# Patient Record
Sex: Male | Born: 1955
Health system: Southern US, Community
[De-identification: ages and names within clinical notes are randomized; demographics above are authoritative.]

## PROBLEM LIST (undated history)

## (undated) DIAGNOSIS — K5790 Diverticulosis of intestine, part unspecified, without perforation or abscess without bleeding: Secondary | ICD-10-CM

## (undated) DIAGNOSIS — I10 Essential (primary) hypertension: Secondary | ICD-10-CM

## (undated) DIAGNOSIS — G4730 Sleep apnea, unspecified: Secondary | ICD-10-CM

## (undated) DIAGNOSIS — E785 Hyperlipidemia, unspecified: Secondary | ICD-10-CM

## (undated) DIAGNOSIS — T7840XA Allergy, unspecified, initial encounter: Secondary | ICD-10-CM

## (undated) DIAGNOSIS — M545 Low back pain, unspecified: Secondary | ICD-10-CM

## (undated) DIAGNOSIS — Z860100 Personal history of colon polyps, unspecified: Secondary | ICD-10-CM

## (undated) DIAGNOSIS — G8929 Other chronic pain: Secondary | ICD-10-CM

## (undated) HISTORY — DX: Allergy, unspecified, initial encounter: T78.40XA

## (undated) HISTORY — DX: Diverticulosis of intestine, part unspecified, without perforation or abscess without bleeding: K57.90

## (undated) HISTORY — DX: Low back pain, unspecified: M54.50

## (undated) HISTORY — DX: Low back pain: M54.5

## (undated) HISTORY — PX: KNEE SURGERY: SHX244

## (undated) HISTORY — DX: Personal history of colonic polyps: Z86.010

## (undated) HISTORY — DX: Sleep apnea, unspecified: G47.30

## (undated) HISTORY — DX: Hyperlipidemia, unspecified: E78.5

## (undated) HISTORY — DX: Personal history of colon polyps, unspecified: Z86.0100

## (undated) HISTORY — DX: Essential (primary) hypertension: I10

## (undated) HISTORY — DX: Other chronic pain: G89.29

---
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
## 2005-05-30 ENCOUNTER — Ambulatory Visit: Payer: Self-pay | Admitting: Gastroenterology

## 2005-06-13 ENCOUNTER — Ambulatory Visit: Payer: Self-pay | Admitting: Gastroenterology

## 2005-06-13 ENCOUNTER — Encounter (INDEPENDENT_AMBULATORY_CARE_PROVIDER_SITE_OTHER): Payer: Self-pay | Admitting: *Deleted

## 2005-06-13 LAB — HM COLONOSCOPY: HM Colonoscopy: NORMAL

## 2005-12-14 ENCOUNTER — Ambulatory Visit: Payer: Self-pay | Admitting: Family Medicine

## 2006-05-22 ENCOUNTER — Ambulatory Visit: Payer: Self-pay | Admitting: Family Medicine

## 2006-06-21 ENCOUNTER — Ambulatory Visit: Payer: Self-pay | Admitting: Family Medicine

## 2007-04-24 ENCOUNTER — Ambulatory Visit: Payer: Self-pay | Admitting: Family Medicine

## 2007-09-04 ENCOUNTER — Ambulatory Visit: Payer: Self-pay | Admitting: Family Medicine

## 2007-11-02 ENCOUNTER — Ambulatory Visit: Payer: Self-pay | Admitting: Family Medicine

## 2008-06-10 ENCOUNTER — Ambulatory Visit: Payer: Self-pay | Admitting: Family Medicine

## 2008-07-19 ENCOUNTER — Emergency Department (HOSPITAL_COMMUNITY): Admission: EM | Admit: 2008-07-19 | Discharge: 2008-07-20 | Payer: Self-pay | Admitting: Emergency Medicine

## 2008-09-24 ENCOUNTER — Ambulatory Visit: Payer: Self-pay | Admitting: Family Medicine

## 2009-04-21 ENCOUNTER — Ambulatory Visit: Payer: Self-pay | Admitting: Family Medicine

## 2009-07-23 ENCOUNTER — Ambulatory Visit: Payer: Self-pay | Admitting: Family Medicine

## 2010-02-09 ENCOUNTER — Ambulatory Visit: Payer: Self-pay | Admitting: Family Medicine

## 2010-04-08 ENCOUNTER — Ambulatory Visit (HOSPITAL_BASED_OUTPATIENT_CLINIC_OR_DEPARTMENT_OTHER)
Admission: RE | Admit: 2010-04-08 | Discharge: 2010-04-08 | Payer: Self-pay | Source: Home / Self Care | Attending: Family Medicine | Admitting: Family Medicine

## 2010-04-08 ENCOUNTER — Encounter: Payer: Self-pay | Admitting: Pulmonary Disease

## 2010-04-30 ENCOUNTER — Ambulatory Visit
Admission: RE | Admit: 2010-04-30 | Discharge: 2010-04-30 | Payer: Self-pay | Source: Home / Self Care | Attending: Family Medicine | Admitting: Family Medicine

## 2010-06-02 ENCOUNTER — Encounter: Payer: Self-pay | Admitting: Gastroenterology

## 2010-06-09 NOTE — Letter (Signed)
Summary: Colonoscopy Letter  [facility]  [address]   [place], [place] [address]   Phone: [phone]  Fax: [phone]      June 02, 2010 MRN: [id number]   Antonio Harvey [address] [place], [place]  [address]   Dear Mr. Braddy,   According to your medical record, it is time for you to schedule a Colonoscopy. The American Cancer Society recommends this procedure as a method to detect early colon cancer. Patients with a family history of colon cancer, or a personal history of colon polyps or inflammatory bowel disease are at increased risk.  This letter has been generated based on the recommendations made at the time of your procedure. If you feel that in your particular situation this may no longer apply, please contact our office.  Please call our office at [phone] to schedule this appointment or to update your records at your earliest convenience.  Thank you for cooperating with [place] to provide you with the very best care possible.   Sincerely,   Sheryn Bison, M.D.  [facility] Gastroenterology Division [phone]

## 2010-10-22 ENCOUNTER — Encounter: Payer: Self-pay | Admitting: Family Medicine

## 2010-10-25 ENCOUNTER — Ambulatory Visit (INDEPENDENT_AMBULATORY_CARE_PROVIDER_SITE_OTHER): Payer: BC Managed Care – PPO | Admitting: Medical

## 2010-10-25 ENCOUNTER — Encounter: Payer: Self-pay | Admitting: Medical

## 2010-10-25 VITALS — BP 124/82 | HR 60 | Temp 98.1°F | Ht 69.0 in | Wt 193.0 lb

## 2010-10-25 DIAGNOSIS — E785 Hyperlipidemia, unspecified: Secondary | ICD-10-CM

## 2010-10-25 DIAGNOSIS — K635 Polyp of colon: Secondary | ICD-10-CM

## 2010-10-25 DIAGNOSIS — D126 Benign neoplasm of colon, unspecified: Secondary | ICD-10-CM

## 2010-10-25 LAB — LIPID PANEL
Cholesterol: 143 mg/dL (ref 0–200)
HDL: 40 mg/dL (ref >39)
Total CHOL/HDL Ratio: 3.6 Ratio

## 2010-10-25 LAB — COMPREHENSIVE METABOLIC PANEL
Albumin: 4.6 g/dL (ref 3.5–5.2)
Alkaline Phosphatase: 86 U/L (ref 39–117)
BUN: 16 mg/dL (ref 6–23)
Glucose, Bld: 94 mg/dL (ref 70–99)
Potassium: 4.2 mEq/L (ref 3.5–5.3)

## 2010-10-25 NOTE — Progress Notes (Signed)
Subjective:    Antonio Harvey is here for follow up of elevated cholesterol. Last visit here 1/12.  Compliance with treatment has been excellent. The patient exercises frequently.  He avoids lots of red meats.  Eats mostly healthy diet with vegetables, fruits, chicken, fish.  Patient denies muscle pain associated with his medications.  He does note some hand stiffness and joint aches in the hands at times.  Risk factors for heart disease include age and male only.  Father did die of stroke.    Last colonoscopy 2007 with diverticulosis and polyps.  No other new c/o.   The following portions of the patient's history were reviewed and updated as appropriate: allergies, current medications, past family history, past medical history, past social history, past surgical history and problem list.  Past Medical History  Diagnosis Date  . Allergy   . Hyperlipidemia   . History of colonic polyps   . Sleep apnea   . Chronic LBP   . Diverticulosis     Review of Systems Constitutional: denies recent fevers, chills, weight changes Skin: denies rash, or other worrisome lesion Cardiology: denies chest pain, palpitations, edema Respiratory: denies shortness of breath, dyspnea on exertion, wheezing Gastroenterology: no abdominal pain, nausea, vomiting, diarrhea, constipation, bowel change, blood in stool Musculoskeletal: denies myalgias, cramps Urology: denies dysuria, frequency, urgency, blood in urine, stream changes Neurology: denies numbness, tingling, weakness, gait changes    Objective:   Filed Vitals:   10/25/10 0923  BP: 124/82  Pulse: 60  Temp: 98.1 F (36.7 C)    General Appearance:    Alert, no distress, WD/WN, black male  Oral cavity:   mucosa normal  Neck:   Supple, no lymphadenopathy;  thyroid:  no    enlargement/tenderness/nodules; no carotid   bruit or JVD  Lungs:     Clear to auscultation bilaterally without wheezes, rales or       rhonchi; respirations unlabored   Heart:     Regular rate and rhythm, S1 and S2 normal, no murmur  Abdomen:     Soft, non-tender, non distended, normoactive bowel sounds,    no masses, no hepatosplenomegaly, no bruits  Extremities:   No clubbing, cyanosis or edema  Pulses:   2+ and symmetric all extremities                Assessment:   Encounter Diagnoses  Name Primary?  . Hyperlipidemia Yes  . Colon polyp      Plan:  Hyperlipidemia:  1. Continue dietary measures. 2. Continue regular exercise. 3. Lipid-lowering medications: Lipitor 20mg .  Consider lowering to 10mg  at patient request if LDL <100.   4. Follow up in 4 months.   Colon polyp - he will call GI to schedule repeat colonoscopy in the next few months.  Last colonoscopy 2007 with plan to repeat in 5 years due to polyps.

## 2010-10-26 ENCOUNTER — Other Ambulatory Visit: Payer: Self-pay | Admitting: Medical

## 2010-10-26 ENCOUNTER — Other Ambulatory Visit: Payer: Self-pay | Admitting: *Deleted

## 2010-10-26 MED ORDER — ATORVASTATIN CALCIUM 20 MG PO TABS: 10.0000 mg | ORAL_TABLET | Freq: Every day | ORAL | Status: DC

## 2010-10-26 NOTE — Telephone Encounter (Addendum)
Message copied by Dorthula Perfect on Tue Oct 26, 2010  1:08 PM ------      Message from: Jac Canavan      Created: Tue Oct 26, 2010  7:46 AM       His bad cholesterol is 18, thus, as we discussed, he can lower his Lipitor to 10 mg, or one half tablet of his current dose daily.  As long as he is maintaining a healthy diet and exercise, we can recheck these numbers again in 4-6 months. His liver tests were normal. His kidney test was slightly elevated, and we can recheck this again in 4 months.   Pt notified of lab results.  Pt understood to take Lipitor 10 mg daily and was informed that prescription for Lipitor was sent to pharmacy.  Pt scheduled to come back in October for a recheck on Labs.  CM, LPN

## 2010-10-27 ENCOUNTER — Telehealth: Payer: Self-pay | Admitting: Medical

## 2010-10-27 NOTE — Telephone Encounter (Signed)
Done

## 2010-12-07 ENCOUNTER — Other Ambulatory Visit: Payer: Self-pay

## 2010-12-07 ENCOUNTER — Telehealth: Payer: Self-pay | Admitting: Medical

## 2010-12-07 MED ORDER — ATORVASTATIN CALCIUM 20 MG PO TABS: 10.0000 mg | ORAL_TABLET | Freq: Every day | ORAL | Status: DC

## 2010-12-07 NOTE — Telephone Encounter (Signed)
Renewed med 90 day supply actually he takes 1/2 a pill daily so 180 day supply

## 2010-12-07 NOTE — Telephone Encounter (Signed)
Med was ordered

## 2011-01-24 ENCOUNTER — Encounter: Payer: Self-pay | Admitting: Family Medicine

## 2011-01-25 ENCOUNTER — Ambulatory Visit (INDEPENDENT_AMBULATORY_CARE_PROVIDER_SITE_OTHER): Payer: BC Managed Care – PPO | Admitting: Medical

## 2011-01-25 ENCOUNTER — Encounter: Payer: Self-pay | Admitting: Medical

## 2011-01-25 VITALS — BP 124/80 | HR 58 | Resp 12 | Ht 69.0 in | Wt 200.0 lb

## 2011-01-25 DIAGNOSIS — K635 Polyp of colon: Secondary | ICD-10-CM

## 2011-01-25 DIAGNOSIS — D126 Benign neoplasm of colon, unspecified: Secondary | ICD-10-CM

## 2011-01-25 DIAGNOSIS — E785 Hyperlipidemia, unspecified: Secondary | ICD-10-CM

## 2011-01-25 DIAGNOSIS — H6120 Impacted cerumen, unspecified ear: Secondary | ICD-10-CM

## 2011-01-25 DIAGNOSIS — R7989 Other specified abnormal findings of blood chemistry: Secondary | ICD-10-CM

## 2011-01-25 DIAGNOSIS — H9319 Tinnitus, unspecified ear: Secondary | ICD-10-CM

## 2011-01-25 DIAGNOSIS — G4730 Sleep apnea, unspecified: Secondary | ICD-10-CM

## 2011-01-25 DIAGNOSIS — R799 Abnormal finding of blood chemistry, unspecified: Secondary | ICD-10-CM

## 2011-01-25 LAB — BASIC METABOLIC PANEL
BUN: 12 mg/dL (ref 6–23)
Chloride: 103 mEq/L (ref 96–112)
Potassium: 4.4 mEq/L (ref 3.5–5.3)

## 2011-01-25 LAB — CBC WITH DIFFERENTIAL/PLATELET
Eosinophils Absolute: 0.1 10*3/uL (ref 0.0–0.7)
Eosinophils Relative: 1 % (ref 0–5)
HCT: 43.3 % (ref 39.0–52.0)
Hemoglobin: 14 g/dL (ref 13.0–17.0)
Lymphs Abs: 2.1 10*3/uL (ref 0.7–4.0)
MCH: 23.5 pg — ABNORMAL LOW (ref 26.0–34.0)
MCV: 72.8 fL — ABNORMAL LOW (ref 78.0–100.0)
Monocytes Absolute: 0.3 10*3/uL (ref 0.1–1.0)
Monocytes Relative: 5 % (ref 3–12)
Platelets: 153 10*3/uL (ref 150–400)
RBC: 5.95 MIL/uL — ABNORMAL HIGH (ref 4.22–5.81)

## 2011-01-25 LAB — LIPID PANEL
LDL Cholesterol: 90 mg/dL (ref 0–99)
VLDL: 19 mg/dL (ref 0–40)

## 2011-01-25 NOTE — Progress Notes (Signed)
Subjective:    Antonio Harvey is here for follow up.  He sees both [place] and the [place] [facility] for primary care.    Here for recheck on cholesterol.  Last visit we lowered his Lipitor to 10mg  daily given his improved lipid labs.  He is here for recheck on this today.    He is exercising, eating healthy.   He has repeat colonoscopy this month for polyps.  Last colonoscopy 2007.  He has CPAP titration scheduled soon for mask refitting.    He has ENT f/u soon through [facility] hospital for tinnitus.  He c/o ear wax blocked up today.  He has had to have irrigation in the past for same.   Last PSA screening 2/12 at the [place].  The following portions of the patient's history were reviewed and updated as appropriate: allergies, current medications, past family history, past medical history, past social history, past surgical history and problem list.  Past Medical History  Diagnosis Date  . Allergy   . Hyperlipidemia   . History of colonic polyps   . Sleep apnea   . Chronic LBP   . Diverticulosis     Review of Systems Constitutional: denies recent fevers, chills, weight changes Skin: denies rash, or other worrisome lesion Cardiology: denies chest pain, palpitations, edema Respiratory: denies shortness of breath, dyspnea on exertion, wheezing Gastroenterology: no abdominal pain, nausea, vomiting, diarrhea, constipation, bowel change, blood in stool Musculoskeletal: denies myalgias, cramps Urology: denies dysuria, frequency, urgency, blood in urine, stream changes Neurology: denies numbness, tingling, weakness, gait changes    Objective:   Filed Vitals:   01/25/11 0939  BP: 124/80  Pulse: 58    General Appearance:    Alert, no distress, WD/WN, black male  Oral cavity:   mucosa normal Ears: moderate impacted cerumen bilat  Neck:   Supple, no lymphadenopathy;  thyroid:  no    enlargement/tenderness/nodules; no carotid   bruit or JVD  Lungs:     Clear to auscultation bilaterally without wheezes,  rales or       rhonchi; respirations unlabored   Heart:    Regular rate and rhythm, S1 and S2 normal, no murmur  Abdomen:     Soft, non-tender, non distended, normoactive bowel sounds,    no masses, no hepatosplenomegaly, no bruits  Extremities:   No clubbing, cyanosis or edema  Pulses:   2+ and symmetric all extremities                Assessment:   Encounter Diagnoses  Name Primary?  . Hyperlipidemia Yes  . Impacted cerumen   . Creatinine elevation   . Tinnitus   . Sleep apnea   . Colon polyp      Plan:  Hyperlipidemia - last visit we reduced to Lipitor 10mg  daily.  Will check labs today.  If at goal, recheck lipid in 1 year.  Impacted cerumen - at his request, we successful removed moderate cerumen impaction from bilat ear canals with warm water flush  Creatinine elevated - recheck labs today  Tinnitus - f/u with [facility] ENT for eval as scheduled.  Sleep Apnea - f/u as planned for sleep study through [place]  Colon Polyp - is scheduled this month for repeat colonoscopy

## 2011-01-25 NOTE — Patient Instructions (Signed)
We will recheck your cholesterol today.    We cleaned impacted ear wax out of your ears today.  Follow up soon as planned for repeat colonoscopy.   Follow up as planned for evaluation of ringing in ears at the [place].  Follow up as planned for your CPAP titration soon as scheduled.  Get your flu shot soon through work.   Continue regular exercise, healthy diet.  We will call with lab results.

## 2011-11-09 ENCOUNTER — Encounter: Payer: BC Managed Care – PPO | Admitting: Medical

## 2011-11-14 ENCOUNTER — Encounter: Payer: BC Managed Care – PPO | Admitting: Medical

## 2011-11-28 ENCOUNTER — Ambulatory Visit (AMBULATORY_SURGERY_CENTER): Payer: BC Managed Care – PPO

## 2011-11-28 VITALS — Ht 69.0 in | Wt 200.0 lb

## 2011-11-28 DIAGNOSIS — Z860100 Personal history of colon polyps, unspecified: Secondary | ICD-10-CM

## 2011-11-28 MED ORDER — MOVIPREP 100 G PO SOLR: 1.0000 | Freq: Once | ORAL | Status: DC

## 2011-11-29 ENCOUNTER — Encounter: Payer: Self-pay | Admitting: Gastroenterology

## 2011-12-12 ENCOUNTER — Ambulatory Visit (AMBULATORY_SURGERY_CENTER): Payer: BC Managed Care – PPO | Admitting: Gastroenterology

## 2011-12-12 ENCOUNTER — Encounter: Payer: Self-pay | Admitting: Gastroenterology

## 2011-12-12 VITALS — BP 133/79 | HR 75 | Temp 97.9°F | Resp 15 | Ht 69.0 in | Wt 200.0 lb

## 2011-12-12 DIAGNOSIS — D126 Benign neoplasm of colon, unspecified: Secondary | ICD-10-CM

## 2011-12-12 DIAGNOSIS — Z86010 Personal history of colonic polyps: Secondary | ICD-10-CM

## 2011-12-12 MED ORDER — SODIUM CHLORIDE 0.9 % IV SOLN: 500.0000 mL | INTRAVENOUS | Status: DC

## 2011-12-12 NOTE — Progress Notes (Signed)
Patient did not experience any of the following events: a burn prior to discharge; a fall within the facility; wrong site/side/patient/procedure/implant event; or a hospital transfer or hospital admission upon discharge from the facility. (G8907) Patient did not have preoperative order for IV antibiotic SSI prophylaxis. (G8918)  

## 2011-12-12 NOTE — Op Note (Signed)
[facility] [address] [place], [address]   COLONOSCOPY PROCEDURE REPORT  PATIENT: Antonio, Harvey  MR#: [id number] BIRTHDATE: 1955/09/24 , 56  yrs. old GENDER: Male ENDOSCOPIST: Mardella Layman, MD, [facility] REFERRED BY: PROCEDURE DATE:  12/12/2011 PROCEDURE:   Colonoscopy with biopsy ASA CLASS:   Class II INDICATIONS:follow up of adenomatous colonic polyp(s). MEDICATIONS: propofol (Diprivan) 200mg  IV  DESCRIPTION OF PROCEDURE:   After the risks and benefits and of the procedure were explained, informed consent was obtained.  A digital rectal exam revealed no abnormalities of the rectum.    The LB [id number]  endoscope was introduced through the anus and advanced to the cecum, which was identified by both the appendix and ileocecal valve .  The quality of the prep was excellent, using MoviPrep .  The instrument was then slowly withdrawn as the colon was fully examined.     COLON FINDINGS: A diminutive flat polyp was found in the rectum.  A biopsy was performed.   The colon mucosa was otherwise normal. Moderate diverticulosis was noted in the descending colon and sigmoid colon.     Retroflexed views revealed no abnormalities. The scope was then withdrawn from the patient and the procedure completed.  COMPLICATIONS: There were no complications. ENDOSCOPIC IMPRESSION: Diminutive flat polyp was found in the rectum; biopsy was performed ,R/O ADENOMA.  RECOMMENDATIONS: 1.  Continue current medications 2.  Await pathology results 3.  Continue current colorectal screening recommendations for "routine risk" patients with a repeat colonoscopy in 10 years. 4.  High fiber diet   REPEAT EXAM:  cc:Talmadge Coventry, MD  _______________________________ eSigned:  Mardella Layman, MD, [facility] 12/12/2011 1:58 PM     PATIENT NAME:  Antonio, Harvey MR#: [id number]

## 2011-12-12 NOTE — Patient Instructions (Addendum)
Discharge instructions given with verbal understanding. Handout on polyps. Resume previous medications. YOU HAD AN ENDOSCOPIC PROCEDURE TODAY AT THE [facility]: Refer to the procedure report that was given to you for any specific questions about what was found during the examination.  If the procedure report does not answer your questions, please call your gastroenterologist to clarify.  If you requested that your care partner not be given the details of your procedure findings, then the procedure report has been included in a sealed envelope for you to review at your convenience later.  YOU SHOULD EXPECT: Some feelings of bloating in the abdomen. Passage of more gas than usual.  Walking can help get rid of the air that was put into your GI tract during the procedure and reduce the bloating. If you had a lower endoscopy (such as a colonoscopy or flexible sigmoidoscopy) you may notice spotting of blood in your stool or on the toilet paper. If you underwent a bowel prep for your procedure, then you may not have a normal bowel movement for a few days.  DIET: Your first meal following the procedure should be a light meal and then it is ok to progress to your normal diet.  A half-sandwich or bowl of soup is an example of a good first meal.  Heavy or fried foods are harder to digest and may make you feel nauseous or bloated.  Likewise meals heavy in dairy and vegetables can cause extra gas to form and this can also increase the bloating.  Drink plenty of fluids but you should avoid alcoholic beverages for 24 hours.  ACTIVITY: Your care partner should take you home directly after the procedure.  You should plan to take it easy, moving slowly for the rest of the day.  You can resume normal activity the day after the procedure however you should NOT DRIVE or use heavy machinery for 24 hours (because of the sedation medicines used during the test).    SYMPTOMS TO REPORT IMMEDIATELY: A  gastroenterologist can be reached at any hour.  During normal business hours, 8:30 AM to 5:00 PM Monday through Friday, call [phone].  After hours and on weekends, please call the GI answering service at [phone] who will take a message and have the physician on call contact you.   Following lower endoscopy (colonoscopy or flexible sigmoidoscopy):  Excessive amounts of blood in the stool  Significant tenderness or worsening of abdominal pains  Swelling of the abdomen that is new, acute  Fever of 100F or higher  FOLLOW UP: If any biopsies were taken you will be contacted by phone or by letter within the next 1-3 weeks.  Call your gastroenterologist if you have not heard about the biopsies in 3 weeks.  Our staff will call the home number listed on your records the next business day following your procedure to check on you and address any questions or concerns that you may have at that time regarding the information given to you following your procedure. This is a courtesy call and so if there is no answer at the home number and we have not heard from you through the emergency physician on call, we will assume that you have returned to your regular daily activities without incident.  SIGNATURES/CONFIDENTIALITY: You and/or your care partner have signed paperwork which will be entered into your electronic medical record.  These signatures attest to the fact that that the information above on your After Visit Summary has been   reviewed and is understood.  Full responsibility of the confidentiality of this discharge information lies with you and/or your care-partner. 

## 2011-12-13 ENCOUNTER — Telehealth: Payer: Self-pay

## 2011-12-13 NOTE — Telephone Encounter (Signed)
  Follow up Call-  Call back number 12/12/2011  Post procedure Call Back phone  # [phone]  Permission to leave phone message Yes     Patient questions:  Do you have a fever, pain , or abdominal swelling? no Pain Score  0 *  Have you tolerated food without any problems? yes  Have you been able to return to your normal activities? yes  Do you have any questions about your discharge instructions: Diet   no Medications  no Follow up visit  no  Do you have questions or concerns about your Care? no  Actions: * If pain score is 4 or above: No action needed, pain <4.

## 2011-12-16 ENCOUNTER — Encounter: Payer: Self-pay | Admitting: Gastroenterology

## 2011-12-22 ENCOUNTER — Other Ambulatory Visit: Payer: Self-pay | Admitting: Medical

## 2011-12-22 NOTE — Telephone Encounter (Signed)
No more refills patient needs a office visit.

## 2012-01-25 ENCOUNTER — Other Ambulatory Visit: Payer: Self-pay | Admitting: Medical

## 2012-01-28 ENCOUNTER — Other Ambulatory Visit: Payer: Self-pay | Admitting: Medical

## 2012-01-30 ENCOUNTER — Other Ambulatory Visit: Payer: Self-pay | Admitting: Internal Medicine

## 2012-01-30 MED ORDER — ATORVASTATIN CALCIUM 20 MG PO TABS: 10.0000 mg | ORAL_TABLET | Freq: Every day | ORAL | Status: DC

## 2012-01-30 NOTE — Telephone Encounter (Signed)
RX REFILL WAS SENT AND A MESSAGE WAS LEFT FOR THE PATIENT TO SCHEDULE A FOLLOW APPOINTMENT IN ORDER TO RECEIVE ANY MORE REFILLS. CLS

## 2012-02-01 ENCOUNTER — Other Ambulatory Visit: Payer: Self-pay | Admitting: Medical

## 2012-02-03 ENCOUNTER — Telehealth: Payer: Self-pay | Admitting: Family Medicine

## 2012-02-03 NOTE — Telephone Encounter (Signed)
Pt called and made a medcheck an appointment. Pt needs refill on lipitor until appointment in dec.

## 2012-02-03 NOTE — Telephone Encounter (Signed)
Check with his pharmacy. It looks like this was called in a few days ago

## 2012-02-03 NOTE — Telephone Encounter (Signed)
This was sent in twice.

## 2012-03-09 ENCOUNTER — Encounter: Payer: Self-pay | Admitting: Internal Medicine

## 2012-03-23 ENCOUNTER — Encounter: Payer: Self-pay | Admitting: Family Medicine

## 2012-03-23 ENCOUNTER — Ambulatory Visit (INDEPENDENT_AMBULATORY_CARE_PROVIDER_SITE_OTHER): Payer: BC Managed Care – PPO | Admitting: Family Medicine

## 2012-03-23 VITALS — BP 122/80 | HR 68 | Wt 205.0 lb

## 2012-03-23 DIAGNOSIS — E785 Hyperlipidemia, unspecified: Secondary | ICD-10-CM

## 2012-03-23 DIAGNOSIS — N529 Male erectile dysfunction, unspecified: Secondary | ICD-10-CM

## 2012-03-23 DIAGNOSIS — Z79899 Other long term (current) drug therapy: Secondary | ICD-10-CM

## 2012-03-23 DIAGNOSIS — G4730 Sleep apnea, unspecified: Secondary | ICD-10-CM

## 2012-03-23 LAB — CBC WITH DIFFERENTIAL/PLATELET
Basophils Absolute: 0 10*3/uL (ref 0.0–0.1)
Eosinophils Absolute: 0 10*3/uL (ref 0.0–0.7)
Eosinophils Relative: 1 % (ref 0–5)
HCT: 42 % (ref 39.0–52.0)
Lymphocytes Relative: 36 % (ref 12–46)
MCH: 23.6 pg — ABNORMAL LOW (ref 26.0–34.0)
MCV: 71.9 fL — ABNORMAL LOW (ref 78.0–100.0)
Monocytes Absolute: 0.5 10*3/uL (ref 0.1–1.0)
RDW: 17.6 % — ABNORMAL HIGH (ref 11.5–15.5)
WBC: 6.5 10*3/uL (ref 4.0–10.5)

## 2012-03-23 LAB — COMPREHENSIVE METABOLIC PANEL
ALT: 25 U/L (ref 0–53)
Alkaline Phosphatase: 78 U/L (ref 39–117)
CO2: 25 mEq/L (ref 19–32)
Sodium: 140 mEq/L (ref 135–145)
Total Bilirubin: 0.6 mg/dL (ref 0.3–1.2)
Total Protein: 7.1 g/dL (ref 6.0–8.3)

## 2012-03-23 LAB — LIPID PANEL
HDL: 39 mg/dL — ABNORMAL LOW (ref >39)
LDL Cholesterol: 78 mg/dL (ref 0–99)
Total CHOL/HDL Ratio: 3.3 Ratio

## 2012-03-23 MED ORDER — ATORVASTATIN CALCIUM 20 MG PO TABS: ORAL_TABLET | ORAL | Status: DC

## 2012-03-23 NOTE — Progress Notes (Signed)
  Subjective:    Patient ID: EPHRAM KORNEGAY, male    DOB: 1955-09-01, 56 y.o.   MRN: [id number]  HPI He is here for medication check. He gets most of his medical care through the [place]. Presently he does have sleep apnea and is doing quite well on his CPAP. He has no concerns about this. He continues on 10 mg of Lipitor however he is taking a 20 mg tablet in cutting it in half. He also has erectile dysfunction and presently is not using Viagra but apparently does have a prescription for this. He has no other concerns or complaints   Review of Systems     Objective:   Physical Exam Alert and in no distress. Cardiac exam shows regular rhythm without murmurs or gallops. Lungs are clear to auscultation. Abdominal exam shows no masses.       Assessment & Plan:   1. Sleep apnea    2. Hyperlipidemia  atorvastatin (LIPITOR) 20 MG tablet  3. ED (erectile dysfunction)

## 2012-03-23 NOTE — Addendum Note (Signed)
Addended by: Ronnald Nian on: 03/23/2012 02:38 PM   Modules accepted: Orders

## 2012-03-25 NOTE — Progress Notes (Signed)
Quick Note:  Labs are okay. Continue present medications. Recommend he take a multivitamin with iron ______

## 2012-10-08 ENCOUNTER — Telehealth: Payer: Self-pay | Admitting: Family Medicine

## 2012-10-08 DIAGNOSIS — E785 Hyperlipidemia, unspecified: Secondary | ICD-10-CM

## 2012-10-08 MED ORDER — ATORVASTATIN CALCIUM 20 MG PO TABS: ORAL_TABLET | ORAL | Status: DC

## 2012-10-08 NOTE — Telephone Encounter (Signed)
Med sent in.

## 2013-03-19 ENCOUNTER — Ambulatory Visit (INDEPENDENT_AMBULATORY_CARE_PROVIDER_SITE_OTHER): Payer: BC Managed Care – PPO | Admitting: Family Medicine

## 2013-03-19 ENCOUNTER — Encounter: Payer: Self-pay | Admitting: Family Medicine

## 2013-03-19 VITALS — BP 118/74 | HR 64 | Ht 69.0 in | Wt 205.0 lb

## 2013-03-19 DIAGNOSIS — Z125 Encounter for screening for malignant neoplasm of prostate: Secondary | ICD-10-CM

## 2013-03-19 DIAGNOSIS — Z0000 Encounter for general adult medical examination without abnormal findings: Secondary | ICD-10-CM

## 2013-03-19 DIAGNOSIS — E785 Hyperlipidemia, unspecified: Secondary | ICD-10-CM

## 2013-03-19 DIAGNOSIS — G4730 Sleep apnea, unspecified: Secondary | ICD-10-CM

## 2013-03-19 DIAGNOSIS — Z23 Encounter for immunization: Secondary | ICD-10-CM

## 2013-03-19 LAB — CBC WITH DIFFERENTIAL/PLATELET
Basophils Absolute: 0 10*3/uL (ref 0.0–0.1)
Basophils Relative: 0 % (ref 0–1)
Eosinophils Absolute: 0.1 10*3/uL (ref 0.0–0.7)
Eosinophils Relative: 1 % (ref 0–5)
HCT: 43 % (ref 39.0–52.0)
MCHC: 32.8 g/dL (ref 30.0–36.0)
MCV: 71.2 fL — ABNORMAL LOW (ref 78.0–100.0)
Monocytes Absolute: 0.5 10*3/uL (ref 0.1–1.0)
Monocytes Relative: 8 % (ref 3–12)
Platelets: 162 10*3/uL (ref 150–400)
RBC: 6.04 MIL/uL — ABNORMAL HIGH (ref 4.22–5.81)
RDW: 17.4 % — ABNORMAL HIGH (ref 11.5–15.5)

## 2013-03-19 LAB — LIPID PANEL
HDL: 43 mg/dL (ref >39)
LDL Cholesterol: 85 mg/dL (ref 0–99)
Total CHOL/HDL Ratio: 3.4 Ratio
VLDL: 18 mg/dL (ref 0–40)

## 2013-03-19 LAB — COMPREHENSIVE METABOLIC PANEL
AST: 22 U/L (ref 0–37)
Alkaline Phosphatase: 94 U/L (ref 39–117)
BUN: 11 mg/dL (ref 6–23)
Calcium: 9.7 mg/dL (ref 8.4–10.5)
Chloride: 101 mEq/L (ref 96–112)
Creat: 1.27 mg/dL (ref 0.50–1.35)
Total Bilirubin: 0.6 mg/dL (ref 0.3–1.2)

## 2013-03-19 NOTE — Progress Notes (Signed)
Teaching Physician: Sharlot Gowda, MD Dictated By: Judithann Graves  Subjective:  Antonio Harvey is a 57 y.o. male who presents for his annual complete exam. He has no particular concerns today.  He has a history of hyperlipidemia and he continues to take his Atorvastatin for this. He has had no myalgias. His last LFTs drawn 03/2012 were within normal limits. He uses his CPAP nightly for his sleep apnea and this has proven helpful for maintaining sleep through the night. He is followed through the [place] for this. He has no nocturia and no excessive daytime sleepiness. He has no other concerns or complaints. He is getting ready to graduate with his second college degree. His home life is going well. Family and social histories were reviewed.    Review of Systems Constitutional: -fever, -chills, -sweats, -unexpected weight change, -anorexia, -fatigue Allergy: -sneezing, -itching, -congestion Dermatology: denies changing moles, rash, lumps, new worrisome lesions ENT: -runny nose, -ear pain, -sore throat, -hoarseness, -sinus pain, -teeth pain, -tinnitus, -hearing loss, -epistaxis Cardiology:  -chest pain, -palpitations, -edema, -orthopnea, -paroxysmal nocturnal dyspnea Respiratory: -cough, -shortness of breath, -dyspnea on exertion, -wheezing, -hemoptysis Gastroenterology: -abdominal pain, -nausea, -vomiting, -diarrhea, -constipation, -blood in stool, -changes in bowel movement, -dysphagia Hematology: -bleeding or bruising problems Musculoskeletal: -arthralgias, -myalgias, -joint swelling, -back pain, -neck pain, -cramping, -gait changes Ophthalmology: -vision changes, -eye redness, -itching, -discharge Urology: -dysuria, -difficulty urinating, -hematuria, -urinary frequency, -urgency, incontinence Neurology: -headache, -weakness, -tingling, -numbness, -speech abnormality, -memory loss, -falls, -dizziness Psychology:  -depressed mood, -agitation, -sleep problems   Objective: Filed Vitals:   03/19/13  1344  BP: 118/74  Pulse: 64    Physical Exam:  BP 118/74  Pulse 64  Ht 5\' 9"  (1.753 m)  Wt 205 lb (92.987 kg)  BMI 30.26 kg/m2  SpO2 99%  General Appearance:    Alert, cooperative, no distress, appears stated age  Head:    Normocephalic, without obvious abnormality, atraumatic  Eyes:    PERRL, conjunctiva/corneas clear, EOM's intact, fundi    benign  Ears:    Normal TM's and external ear canals  Nose:   Nares normal, mucosa normal, no drainage or sinus   tenderness  Throat:   Lips, mucosa, and tongue normal; teeth and gums normal  Neck:   Supple, no lymphadenopathy;  thyroid:  no   enlargement/tenderness/nodules; no carotid   bruit or JVD  Back:    Spine nontender, no curvature, ROM normal, no CVA     tenderness  Lungs:     Clear to auscultation bilaterally without wheezes, rales or     ronchi; respirations unlabored  Chest Wall:    No tenderness or deformity   Heart:    Regular rate and rhythm, S1 and S2 normal, no murmur, rub   or gallop  Breast Exam:    No chest wall tenderness, masses or gynecomastia  Abdomen:     Soft, non-tender, nondistended, normoactive bowel sounds,    no masses, no hepatosplenomegaly  Extremities:   No clubbing, cyanosis or edema  Pulses:   2+ and symmetric all extremities  Skin:   Skin color, texture, turgor normal, no rashes or lesions  Lymph nodes:   Cervical, supraclavicular, and axillary nodes normal  Neurologic:   CNII-XII intact, normal strength, sensation and gait; reflexes 2+ and symmetric throughout          Psych:   Normal mood, affect, hygiene and grooming.     Assessment and Plan: 1. Need for prophylactic vaccination and inoculation against influenza - Flu  Vaccine QUAD 36+ mos PF IM (Fluarix)  2. Immunization due - Tdap vaccine greater than or equal to 7yo IM  3. Sleep apnea Well managed with nightly use of his CPAP.  4. Hyperlipidemia Checking lipid levels and LFTs today.   Dr. Susann Givens was present for the encounter and  agrees with the above assessment and plan.

## 2013-03-20 LAB — PSA: PSA: 1.14 ng/mL (ref <=4.00)

## 2013-04-26 ENCOUNTER — Encounter: Payer: Self-pay | Admitting: Family Medicine

## 2013-04-26 ENCOUNTER — Ambulatory Visit (INDEPENDENT_AMBULATORY_CARE_PROVIDER_SITE_OTHER): Payer: BC Managed Care – PPO | Admitting: Family Medicine

## 2013-04-26 VITALS — BP 152/82 | HR 91 | Wt 211.0 lb

## 2013-04-26 DIAGNOSIS — G4730 Sleep apnea, unspecified: Secondary | ICD-10-CM

## 2013-04-26 DIAGNOSIS — H9313 Tinnitus, bilateral: Secondary | ICD-10-CM

## 2013-04-26 DIAGNOSIS — H9319 Tinnitus, unspecified ear: Secondary | ICD-10-CM

## 2013-04-26 NOTE — Progress Notes (Signed)
   Subjective:    Patient ID: Antonio Harvey, male    DOB: December 01, 1955, 58 y.o.   MRN: [id number]  HPI He is here for consultation. He has a history of tinnitus as well as sleep apnea. Notes he is had difficulty with ringing in his ears for several years now. He notes that when he uses the CPAP machine and wakes up, the ringing in his ears interfers with him falling back asleep. He does use white noise in the form of a TV to fall asleep however when he wakes up he cannot turn TV back on due to his wife being in the room and interfering with her sleeping.  Review of Systems     Objective:   Physical Exam Alert and in no distress otherwise not examined       Assessment & Plan:  Tinnitus, bilateral  Sleep apnea  did recommend the use of a different white noise machine possibly a fan help. Discussed the diagnosis of tinnitus infected there is no therapy for this nor is there any definite cause of it. He'll continue on his CPAP. He wants to take this information to the [place].

## 2013-05-01 ENCOUNTER — Telehealth: Payer: Self-pay | Admitting: Family Medicine

## 2013-05-02 NOTE — Telephone Encounter (Signed)
Dr. Redmond School, Antonio Harvey has left for the day and pt called and needs this letter written and will pick up by 2pm tomorrow to take to [facility] with him. i am not sure if Antonio Harvey will be here tomorrow.

## 2013-05-02 NOTE — Telephone Encounter (Signed)
Write a letter concerning this.

## 2013-05-03 ENCOUNTER — Encounter: Payer: Self-pay | Admitting: Family Medicine

## 2013-06-24 ENCOUNTER — Encounter: Payer: Self-pay | Admitting: Medical

## 2013-06-24 ENCOUNTER — Ambulatory Visit (INDEPENDENT_AMBULATORY_CARE_PROVIDER_SITE_OTHER): Payer: BC Managed Care – PPO | Admitting: Medical

## 2013-06-24 VITALS — BP 120/78 | HR 68 | Temp 98.2°F | Resp 16 | Wt 202.0 lb

## 2013-06-24 DIAGNOSIS — K602 Anal fissure, unspecified: Secondary | ICD-10-CM

## 2013-06-24 MED ORDER — HYDROCODONE-ACETAMINOPHEN 10-325 MG PO TABS: ORAL_TABLET | ORAL | Status: DC

## 2013-06-24 MED ORDER — DOCUSATE SODIUM 100 MG PO TABS: 100.0000 mg | ORAL_TABLET | Freq: Two times a day (BID) | ORAL | Status: DC

## 2013-06-24 MED ORDER — HYDROCORTISONE 2.5 % RE CREA: 1.0000 "application " | TOPICAL_CREAM | Freq: Two times a day (BID) | RECTAL | Status: DC

## 2013-06-24 NOTE — Progress Notes (Signed)
Subjective Here for pain at rectum that started few days ago.   Had stomach virus a week ago, had diarrhea for a few days, then soon after started having pain in rectal area. Been using some preparation H.  Over the weekend was miserable in pain.  Doesn't feel any hemorrhoids poking out.   No prior similar issue like this. Denies fever, pus drainage, no bleeding.  He is afraid to have a BM due to the pain.  Objective: General: Well-developed, well-nourished, acute distress Anterior anus with 1 cm x 4 mm x 3 mm deep fissure, quite tender No surrounding erythema, induration, fluctuance or pus  Assessment: Encounter Diagnosis  Name Primary?  Marland Kitchen Anal fissure Yes   Plan: We discussed the findings, diagnosis, treatment, possible complications.  Specific recommendations today include:  Begin Epsom Salt in hot water soaks for 20 minutes several times daily if possible  Begin Anusol cream at the anus per label  You may use the pain medication, 1/2-1 tablet every 4-6 hours for pain  Begin Docusate Sodium stool softener twice daily until this gets much better  If desired, you can use OTC Miralax for constipation  Drink plenty of water daily  Keep your solid food intake somewhat low to prevent having to have bulky bowel movements  Return 1 week.

## 2013-06-24 NOTE — Patient Instructions (Signed)
  Thank you for giving me the opportunity to serve you today.    Your diagnosis today includes: Encounter Diagnosis  Name Primary?  Marland Kitchen Anal fissure Yes     Specific recommendations today include:  Begin Epsom Salt in hot water soaks for 20 minutes several times daily if possible  Begin Anusol cream at the anus per label  You may use the pain medication, 1/2-1 tablet every 4-6 hours for pain  Begin Docusate Sodium stool softener twice daily until this gets much better  If desired, you can use OTC Miralax for constipation  Drink plenty of water daily  Keep your solid food intake somewhat low to prevent having to have bulky bowel movements  Follow up: in 1 week   I have included other useful information below for your review.  Anal Fissure, Adult An anal fissure is a small tear or crack in the skin around the anus. Bleeding from a fissure usually stops on its own within a few minutes. However, bleeding will often reoccur with each bowel movement until the crack heals.  CAUSES   Passing large, hard stools.  Frequent diarrheal stools.  Constipation.  Inflammatory bowel disease (Crohn's disease or ulcerative colitis).  Infections.  Anal sex. SYMPTOMS   Small amounts of blood seen on your stools, on toilet paper, or in the toilet after a bowel movement.  Rectal bleeding.  Painful bowel movements.  Itching or irritation around the anus. DIAGNOSIS Your caregiver will examine the anal area. An anal fissure can usually be seen with careful inspection. A rectal exam may be performed and a short tube (anoscope) may be used to examine the anal canal. TREATMENT   You may be instructed to take fiber supplements. These supplements can soften your stool to help make bowel movements easier.  Sitz baths may be recommended to help heal the tear. Do not use soap in the sitz baths.  A medicated cream or ointment may be prescribed to lessen discomfort. HOME CARE INSTRUCTIONS     Maintain a diet high in fruits, whole grains, and vegetables. Avoid constipating foods like bananas and dairy products.  Take sitz baths as directed by your caregiver.  Drink enough fluids to keep your urine clear or pale yellow.  Only take over-the-counter or prescription medicines for pain, discomfort, or fever as directed by your caregiver. Do not take aspirin as this may increase bleeding.  Do not use ointments containing numbing medications (anesthetics) or hydrocortisone. They could slow healing. SEEK MEDICAL CARE IF:   Your fissure is not completely healed within 3 days.  You have further bleeding.  You have a fever.  You have diarrhea mixed with blood.  You have pain.  Your problem is getting worse rather than better. MAKE SURE YOU:   Understand these instructions.  Will watch your condition.  Will get help right away if you are not doing well or get worse. Document Released: 04/04/2005 Document Revised: 06/27/2011 Document Reviewed: 09/19/2010 [facility] Patient Information 2014 [place], [place].

## 2013-06-27 ENCOUNTER — Encounter (HOSPITAL_COMMUNITY): Payer: Self-pay | Admitting: Emergency Medicine

## 2013-06-27 ENCOUNTER — Encounter: Payer: Self-pay | Admitting: Family Medicine

## 2013-06-27 ENCOUNTER — Inpatient Hospital Stay (HOSPITAL_COMMUNITY)
Admission: EM | Admit: 2013-06-27 | Discharge: 2013-07-01 | DRG: 349 | Disposition: A | Discharge status: Home / Self Care | Payer: BC Managed Care – PPO | Attending: Surgery | Admitting: Surgery

## 2013-06-27 ENCOUNTER — Ambulatory Visit (INDEPENDENT_AMBULATORY_CARE_PROVIDER_SITE_OTHER): Payer: BC Managed Care – PPO | Admitting: Family Medicine

## 2013-06-27 ENCOUNTER — Emergency Department (HOSPITAL_COMMUNITY): Payer: BC Managed Care – PPO

## 2013-06-27 VITALS — BP 122/64 | HR 90 | Temp 102.9°F | Wt 202.0 lb

## 2013-06-27 DIAGNOSIS — Z96659 Presence of unspecified artificial knee joint: Secondary | ICD-10-CM

## 2013-06-27 DIAGNOSIS — K611 Rectal abscess: Secondary | ICD-10-CM

## 2013-06-27 DIAGNOSIS — G4730 Sleep apnea, unspecified: Secondary | ICD-10-CM | POA: Diagnosis present

## 2013-06-27 DIAGNOSIS — H9319 Tinnitus, unspecified ear: Secondary | ICD-10-CM | POA: Diagnosis present

## 2013-06-27 DIAGNOSIS — K612 Anorectal abscess: Principal | ICD-10-CM | POA: Diagnosis present

## 2013-06-27 DIAGNOSIS — K602 Anal fissure, unspecified: Secondary | ICD-10-CM | POA: Diagnosis present

## 2013-06-27 DIAGNOSIS — E785 Hyperlipidemia, unspecified: Secondary | ICD-10-CM | POA: Diagnosis present

## 2013-06-27 LAB — CBC WITH DIFFERENTIAL/PLATELET
Basophils Absolute: 0 10*3/uL (ref 0.0–0.1)
Basophils Relative: 0 % (ref 0–1)
EOS ABS: 0 10*3/uL (ref 0.0–0.7)
Eosinophils Relative: 0 % (ref 0–5)
HEMATOCRIT: 37.4 % — AB (ref 39.0–52.0)
HEMOGLOBIN: 12.8 g/dL — AB (ref 13.0–17.0)
LYMPHS ABS: 2.1 10*3/uL (ref 0.7–4.0)
Lymphocytes Relative: 12 % (ref 12–46)
MCH: 24.1 pg — ABNORMAL LOW (ref 26.0–34.0)
MCHC: 34.2 g/dL (ref 30.0–36.0)
MCV: 70.4 fL — AB (ref 78.0–100.0)
Monocytes Absolute: 1.9 10*3/uL — ABNORMAL HIGH (ref 0.1–1.0)
Monocytes Relative: 11 % (ref 3–12)
NEUTROS ABS: 13.2 10*3/uL — AB (ref 1.7–7.7)
Neutrophils Relative %: 77 % (ref 43–77)
Platelets: 225 10*3/uL (ref 150–400)
RBC: 5.31 MIL/uL (ref 4.22–5.81)
RDW: 14.6 % (ref 11.5–15.5)
WBC: 17.2 10*3/uL — ABNORMAL HIGH (ref 4.0–10.5)

## 2013-06-27 LAB — COMPREHENSIVE METABOLIC PANEL WITH GFR
ALT: 39 U/L (ref 0–53)
AST: 30 U/L (ref 0–37)
Albumin: 3.6 g/dL (ref 3.5–5.2)
Alkaline Phosphatase: 119 U/L — ABNORMAL HIGH (ref 39–117)
BUN: 16 mg/dL (ref 6–23)
CO2: 25 meq/L (ref 19–32)
Calcium: 9.4 mg/dL (ref 8.4–10.5)
Chloride: 93 meq/L — ABNORMAL LOW (ref 96–112)
Creatinine, Ser: 1.53 mg/dL — ABNORMAL HIGH (ref 0.50–1.35)
GFR calc Af Amer: 56 mL/min — ABNORMAL LOW
GFR calc non Af Amer: 48 mL/min — ABNORMAL LOW
Glucose, Bld: 92 mg/dL (ref 70–99)
Potassium: 4.7 meq/L (ref 3.7–5.3)
Sodium: 135 meq/L — ABNORMAL LOW (ref 137–147)
Total Bilirubin: 0.6 mg/dL (ref 0.3–1.2)
Total Protein: 7.9 g/dL (ref 6.0–8.3)

## 2013-06-27 LAB — I-STAT CG4 LACTIC ACID, ED: Lactic Acid, Venous: 1.05 mmol/L (ref 0.5–2.2)

## 2013-06-27 MED ORDER — IOHEXOL 300 MG/ML  SOLN
100.0000 mL | Freq: Once | INTRAMUSCULAR | Status: AC | PRN
  Administered 2013-06-27: 100 mL via INTRAVENOUS

## 2013-06-27 MED ORDER — MORPHINE SULFATE 4 MG/ML IJ SOLN
8.0000 mg | Freq: Once | INTRAMUSCULAR | Status: AC
  Administered 2013-06-27: 8 mg via INTRAMUSCULAR
  Filled 2013-06-27: qty 2

## 2013-06-27 MED ORDER — ACETAMINOPHEN 325 MG PO TABS
650.0000 mg | ORAL_TABLET | Freq: Once | ORAL | Status: AC
  Administered 2013-06-27: 650 mg via ORAL

## 2013-06-27 MED ORDER — AMPICILLIN-SULBACTAM SODIUM 3 (2-1) G IJ SOLR
3.0000 g | Freq: Once | INTRAMUSCULAR | Status: AC
  Administered 2013-06-27: 3 g via INTRAVENOUS
  Filled 2013-06-27: qty 3

## 2013-06-27 MED ORDER — IOHEXOL 300 MG/ML  SOLN: 25.0000 mL | Freq: Once | INTRAMUSCULAR | Status: AC | PRN

## 2013-06-27 MED ORDER — SODIUM CHLORIDE 0.9 % IV BOLUS (SEPSIS)
500.0000 mL | Freq: Once | INTRAVENOUS | Status: AC
  Administered 2013-06-27: 500 mL via INTRAVENOUS

## 2013-06-27 NOTE — ED Provider Notes (Signed)
Pt with concerning exam for perirectal abscess Will likely need CT imaging Surgery consulted as well Pt currently stable but he is febrile BP 128/69  Pulse 100  Temp(Src) 102.2 F (39 C) (Oral)  Resp 18  Ht 5\' 10"  (1.778 m)  Wt 202 lb (91.627 kg)  BMI 28.98 kg/m2  SpO2 98%   Sharyon Cable, MD 06/27/13 2044

## 2013-06-27 NOTE — Progress Notes (Signed)
   Subjective:    Patient ID: Antonio Harvey, male    DOB: 24-Jan-1956, 58 y.o.   MRN: [id number]  HPI He is here for a recheck. He was seen recently and treated for a anal fissure. He states that he has had a fever for the last several days as well as extreme rectal and back discomfort. He cannot sit without pain. He is unable to work. He also states that he has been unable to have a bowel movement because of the pain. He also admits to having urinary difficulty, getting his stream started but then abruptly stopping.   Review of Systems     Objective:   Physical Exam Alert and toxic appearing. Temperature 102.9 .Abdominal exam shows no masses or tenderness. Rectal exam shows purulent drainage at the 6:00 position from what appears to be a rather large fissure. Digital rectal exam was uncomfortable but no true mass could be palpated. General surgery was called and they recommended sending him to the emergency room.      Assessment & Plan:  Perirectal abscess  he is to go directly to [facility] emergency room. I wrote a note with his temperature on it as well as my presumed diagnosis and to call general surgery.

## 2013-06-27 NOTE — H&P (Signed)
Antonio Harvey is an 57 y.o. male.   Chief Complaint: perirectal abscess HPI:  Pt is a 58 yo M who presents with 1 week history of perirectal pain that has worsened.  He has done sitz baths and hemorrhoid cream.  Saw PCP today and was found to have perirectal abscess.  He also had a fever.  He is retired Event organiser.  He denies rigors.  He normally is not constipated, but since he started having rectal pain, he has been.  He has not taken any antibiotics.    Past Medical History  Diagnosis Date  . Allergy   . Hyperlipidemia   . History of colonic polyps   . Sleep apnea   . Chronic LBP   . Diverticulosis     Past Surgical History  Procedure Laterality Date  . Tonsillectomy and adenoidectomy  1966  . Joint replacement  1994    RT ARTHROSCOPIC KNEE SURGERY    Family History  Problem Relation Age of Onset  . Asthma Mother   . Hypertension Mother   . Stroke Father     died of CVA  . Kidney disease Father     dialysis  . Diabetes Father   . Arthritis Sister   . Colon cancer Neg Hx    Social History:  reports that he has never smoked. He has never used smokeless tobacco. He reports that he does not drink alcohol or use illicit drugs.  Allergies: No Known Allergies  Meds:  MedicationsLong-Term  Outpatient Medications Hospital Medications   atorvastatin (LIPITOR) 20 MG tablet  docusate sodium (COLACE) 100 MG capsule  HYDROcodone-acetaminophen (NORCO) 10-325 MG per tablet  hydrocortisone (ANUSOL-HC) 2.5 % rectal cream     Results for orders placed during the hospital encounter of 06/27/13 (from the past 48 hour(s))  CBC WITH DIFFERENTIAL     Status: Abnormal   Collection Time    06/27/13  7:19 PM      Result Value Ref Range   WBC 17.2 (*) 4.0 - 10.5 K/uL   RBC 5.31  4.22 - 5.81 MIL/uL   Hemoglobin 12.8 (*) 13.0 - 17.0 g/dL   HCT 37.4 (*) 39.0 - 52.0 %   MCV 70.4 (*) 78.0 - 100.0 fL   MCH 24.1 (*) 26.0 - 34.0 pg   MCHC 34.2  30.0 - 36.0 g/dL   RDW 14.6  11.5 -  15.5 %   Platelets 225  150 - 400 K/uL   Neutrophils Relative % 77  43 - 77 %   Lymphocytes Relative 12  12 - 46 %   Monocytes Relative 11  3 - 12 %   Eosinophils Relative 0  0 - 5 %   Basophils Relative 0  0 - 1 %   Neutro Abs 13.2 (*) 1.7 - 7.7 K/uL   Lymphs Abs 2.1  0.7 - 4.0 K/uL   Monocytes Absolute 1.9 (*) 0.1 - 1.0 K/uL   Eosinophils Absolute 0.0  0.0 - 0.7 K/uL   Basophils Absolute 0.0  0.0 - 0.1 K/uL   RBC Morphology POLYCHROMASIA PRESENT     WBC Morphology TOXIC GRANULATION    COMPREHENSIVE METABOLIC PANEL     Status: Abnormal   Collection Time    06/27/13  7:19 PM      Result Value Ref Range   Sodium 135 (*) 137 - 147 mEq/L   Potassium 4.7  3.7 - 5.3 mEq/L   Chloride 93 (*) 96 - 112 mEq/L   CO2 25  19 - 32 mEq/L   Glucose, Bld 92  70 - 99 mg/dL   BUN 16  6 - 23 mg/dL   Creatinine, Ser 1.53 (*) 0.50 - 1.35 mg/dL   Calcium 9.4  8.4 - 10.5 mg/dL   Total Protein 7.9  6.0 - 8.3 g/dL   Albumin 3.6  3.5 - 5.2 g/dL   AST 30  0 - 37 U/L   ALT 39  0 - 53 U/L   Alkaline Phosphatase 119 (*) 39 - 117 U/L   Total Bilirubin 0.6  0.3 - 1.2 mg/dL   GFR calc non Af Amer 48 (*) >90 mL/min   GFR calc Af Amer 56 (*) >90 mL/min   Comment: (NOTE)     The eGFR has been calculated using the CKD EPI equation.     This calculation has not been validated in all clinical situations.     eGFR's persistently <90 mL/min signify possible Chronic Kidney     Disease.  I-STAT CG4 LACTIC ACID, ED     Status: None   Collection Time    06/27/13  7:34 PM      Result Value Ref Range   Lactic Acid, Venous 1.05  0.5 - 2.2 mmol/L   No results found.  Review of Systems  Constitutional: Positive for fever and chills.  HENT: Negative.   Cardiovascular: Negative.   Gastrointestinal: Positive for constipation.       Rectal pain  Genitourinary: Negative.   Musculoskeletal: Negative.   Skin:       Pain around anus  Neurological: Negative.   Endo/Heme/Allergies: Negative.   Psychiatric/Behavioral:  Negative.     Blood pressure 123/74, pulse 80, temperature 102.2 F (39 C), temperature source Oral, resp. rate 16, height _0  (1.778 m), weight 202 lb (91.627 kg), SpO2 99.00%. Physical Exam  Constitutional: He is oriented to person, place, and time. He appears well-developed and well-nourished. No distress.  HENT:  Head: Normocephalic and atraumatic.  Eyes: Pupils are equal, round, and reactive to light. No scleral icterus.  Neck: Normal range of motion. No thyromegaly present.  Cardiovascular: Normal rate and regular rhythm.   Respiratory: Effort normal. No respiratory distress.  GI: Soft. He exhibits no distension. There is no tenderness.  Genitourinary: Rectal exam shows tenderness.     Fullness posteriorly and on rectal exam.  No real external fluctuance.    Musculoskeletal: He exhibits no edema and no tenderness.  Neurological: He is alert and oriented to person, place, and time.  Skin: Skin is warm and dry. No rash noted. He is not diaphoretic. No erythema. No pallor.  Psychiatric: He has a normal mood and affect. His behavior is normal. Thought content normal.     Assessment/Plan Perirectal abscess, deep, complicated  IV antibiotics NPO To OR for I&D   Sai Zinn 06/27/2013, 11:47 PM

## 2013-06-27 NOTE — ED Notes (Signed)
Patient transported to CT 

## 2013-06-27 NOTE — ED Notes (Addendum)
Pt reports having rectal pain, went to pcp today and diagnosed with rectal abscess and has temp of 100.1 at triage, was told to come to ED today for general surgery consult. Appears in severe pain at triage, reports difficulty urinating and having bowel movement.

## 2013-06-27 NOTE — ED Provider Notes (Signed)
CSN: [id number]     Arrival date & time 06/27/13  1614 History   First MD Initiated Contact with Patient 06/27/13 1946     Chief Complaint  Patient presents with  . Rectal Pain  . Fever     (Consider location/radiation/quality/duration/timing/severity/associated sxs/prior Treatment) Patient is a 58 y.o. male presenting with abscess. The history is provided by the patient.  Abscess Location:  Ano-genital Ano-genital abscess location: rectum. Size:  Unknown Abscess quality: fluctuance, painful, redness and warmth   Abscess quality: no induration   Duration:  2 days Progression:  Worsening Pain details:    Quality:  Pressure   Severity:  Moderate   Duration:  2 days   Timing:  Constant   Progression:  Worsening Chronicity:  New Context: skin injury (had fissure that developed an abscess)   Context: not diabetes   Relieved by:  Nothing Worsened by:  Nothing tried Ineffective treatments:  None tried Associated symptoms: fever (1 day)   Associated symptoms: no headaches, no nausea and no vomiting     Past Medical History  Diagnosis Date  . Allergy   . Hyperlipidemia   . History of colonic polyps   . Sleep apnea   . Chronic LBP   . Diverticulosis    Past Surgical History  Procedure Laterality Date  . Tonsillectomy and adenoidectomy  1966  . Joint replacement  1994    RT ARTHROSCOPIC KNEE SURGERY   Family History  Problem Relation Age of Onset  . Asthma Mother   . Hypertension Mother   . Stroke Father     died of CVA  . Kidney disease Father     dialysis  . Diabetes Father   . Arthritis Sister   . Colon cancer Neg Hx    History  Substance Use Topics  . Smoking status: Never Smoker   . Smokeless tobacco: Never Used  . Alcohol Use: No    Review of Systems  Constitutional: Positive for fever (1 day). Negative for activity change and appetite change.  HENT: Negative for congestion.   Eyes: Negative for discharge, redness and itching.  Respiratory:  Negative for shortness of breath and wheezing.   Cardiovascular: Negative for chest pain.  Gastrointestinal: Positive for constipation. Negative for nausea, vomiting and diarrhea.       Rectal pain  Genitourinary: Negative for dysuria, hematuria, discharge, penile swelling, scrotal swelling, penile pain and testicular pain.  Skin: Negative for rash.  Neurological: Negative for syncope, weakness and headaches.   No current facility-administered medications on file prior to encounter.   Current Outpatient Prescriptions on File Prior to Encounter  Medication Sig Dispense Refill  . HYDROcodone-acetaminophen (NORCO) 10-325 MG per tablet 1/2-1 tablet po q4-6 hrs prn pain  20 tablet  0  . hydrocortisone (ANUSOL-HC) 2.5 % rectal cream Place 1 application rectally 2 (two) times daily.  30 g  0   No Known Allergies    Allergies  Review of patient's allergies indicates no known allergies.  Home Medications   No current outpatient prescriptions on file. BP 123/74  Pulse 80  Temp(Src) 98 F (36.7 C) (Oral)  Resp 16  Ht 5\' 10"  (1.778 m)  Wt 202 lb (91.627 kg)  BMI 28.98 kg/m2  SpO2 99% Physical Exam  Constitutional: He is oriented to person, place, and time. He appears well-developed and well-nourished. No distress.  HENT:  Head: Normocephalic and atraumatic.  Right Ear: External ear normal.  Left Ear: External ear normal.  Mouth/Throat: Oropharynx is clear  and moist. No oropharyngeal exudate.  Eyes: Conjunctivae are normal. Pupils are equal, round, and reactive to light. Right eye exhibits no discharge. Left eye exhibits no discharge. No scleral icterus.  Neck: Normal range of motion. Neck supple.  Cardiovascular: Regular rhythm, normal heart sounds and intact distal pulses.  Exam reveals no gallop and no friction rub.   No murmur heard. Tachy to 105-110  Pulmonary/Chest: Effort normal and breath sounds normal. No respiratory distress. He has no wheezes. He has no rales. He exhibits  no tenderness.  Abdominal: Soft. Bowel sounds are normal. He exhibits no distension and no mass. There is no tenderness.  Genitourinary: Penis normal.  3 cm perirectal abscess at 0600 position, with significant ttp on rectal exam with small amount of possible fluctuance of rectum  Musculoskeletal: Normal range of motion.  Neurological: He is alert and oriented to person, place, and time. He displays normal reflexes. He exhibits normal muscle tone.  Skin: Skin is warm. No rash noted. He is diaphoretic (mild).    ED Course  Procedures (including critical care time) Labs Review Labs Reviewed  CBC WITH DIFFERENTIAL - Abnormal; Notable for the following:    WBC 17.2 (*)    Hemoglobin 12.8 (*)    HCT 37.4 (*)    MCV 70.4 (*)    MCH 24.1 (*)    Neutro Abs 13.2 (*)    Monocytes Absolute 1.9 (*)    All other components within normal limits  COMPREHENSIVE METABOLIC PANEL - Abnormal; Notable for the following:    Sodium 135 (*)    Chloride 93 (*)    Creatinine, Ser 1.53 (*)    Alkaline Phosphatase 119 (*)    GFR calc non Af Amer 48 (*)    GFR calc Af Amer 56 (*)    All other components within normal limits  I-STAT CG4 LACTIC ACID, ED   Imaging Review Ct Abdomen Pelvis W Contrast  06/27/2013   CLINICAL DATA:  Rectal pain.  EXAM: CT ABDOMEN AND PELVIS WITH CONTRAST  TECHNIQUE: Multidetector CT imaging of the abdomen and pelvis was performed using the standard protocol following bolus administration of intravenous contrast.  CONTRAST:  111mL OMNIPAQUE IOHEXOL 300 MG/ML  SOLN  COMPARISON:  None.  FINDINGS: Visualized lung bases appear normal. No significant osseous abnormality is noted.  No gallstones are noted. The liver spleen and pancreas appear normal. Adrenal glands appear normal. No hydronephrosis or renal obstruction is noted. No renal or ureteral calculi are noted. The appendix appears normal. Mild urinary bladder distention is noted. There is no evidence of bowel obstruction. Inflammatory  changes are seen surrounding the rectum, with multiple small peripherally enhancing fluid collections seen in the perirectal soft tissues concerning for abscesses. The largest measures 40 x 24 x 23 mm. Stool is noted throughout the colon. No significant adenopathy is noted.  IMPRESSION: Mild urinary bladder distention is noted.  Inflammatory changes are seen surrounding the rectum with at least to peripherally enhancing fluid collections seen in the perirectal soft tissues consistent with abscess Critical Value/emergent results were called by telephone at the time of interpretation on 06/27/2013 at 11:47 PM to Dr. Ripley Fraise , who verbally acknowledged these results. .   Electronically Signed   By: Sabino Dick M.D.   On: 06/27/2013 23:48     EKG Interpretation None      MDM   58 y.o. WM w/ PMHx of HLD, w/ cc: of sent from clinic d/t perirectal abscess. Had fissure 1 week  ago, now worsening 2days ago with subj fevers and chills. Saw his PCP who diagnosed and sent ehre for surgery consult. Pt febrile, mildly tachy, exam shows perirectal abscess, concern for internal involvement. Pt given pain meds, antibiotics, and will obtain CT scan to evaluate for deep space infection. D/w surgery who will admit after CT scan. Pt remained HDS, pain controlled while in ED. Admit. Care of case d/w my attending.  Final diagnoses:  None       Sol Passer, MD 06/28/13 [phone]

## 2013-06-28 ENCOUNTER — Encounter (HOSPITAL_COMMUNITY): Payer: Self-pay | Admitting: Critical Care Medicine

## 2013-06-28 ENCOUNTER — Observation Stay (HOSPITAL_COMMUNITY): Payer: BC Managed Care – PPO | Admitting: Critical Care Medicine

## 2013-06-28 ENCOUNTER — Encounter (HOSPITAL_COMMUNITY): Payer: BC Managed Care – PPO | Admitting: Critical Care Medicine

## 2013-06-28 ENCOUNTER — Encounter (HOSPITAL_COMMUNITY): Admission: EM | Disposition: A | Discharge status: Home / Self Care | Payer: Self-pay | Source: Home / Self Care

## 2013-06-28 DIAGNOSIS — K612 Anorectal abscess: Secondary | ICD-10-CM

## 2013-06-28 HISTORY — PX: INCISION AND DRAINAGE PERIRECTAL ABSCESS: SHX1804

## 2013-06-28 LAB — SURGICAL PCR SCREEN
MRSA, PCR: NEGATIVE
Staphylococcus aureus: NEGATIVE

## 2013-06-28 SURGERY — INCISION AND DRAINAGE, ABSCESS, PERIRECTAL
Anesthesia: General

## 2013-06-28 MED ORDER — SUCCINYLCHOLINE CHLORIDE 20 MG/ML IJ SOLN
INTRAMUSCULAR | Status: AC
  Filled 2013-06-28: qty 1

## 2013-06-28 MED ORDER — PIPERACILLIN-TAZOBACTAM 3.375 G IVPB
3.3750 g | Freq: Three times a day (TID) | INTRAVENOUS | Status: DC
  Administered 2013-06-28 – 2013-07-01 (×11): 3.375 g via INTRAVENOUS
  Filled 2013-06-28 (×13): qty 50

## 2013-06-28 MED ORDER — PROPOFOL 10 MG/ML IV BOLUS
INTRAVENOUS | Status: AC
  Filled 2013-06-28: qty 20

## 2013-06-28 MED ORDER — LACTATED RINGERS IV SOLN
INTRAVENOUS | Status: DC | PRN
  Administered 2013-06-28: 09:00:00 via INTRAVENOUS

## 2013-06-28 MED ORDER — BUPIVACAINE LIPOSOME 1.3 % IJ SUSP
INTRAMUSCULAR | Status: DC | PRN
  Administered 2013-06-28: 20 mL

## 2013-06-28 MED ORDER — KETOROLAC TROMETHAMINE 30 MG/ML IJ SOLN
INTRAMUSCULAR | Status: DC | PRN
  Administered 2013-06-28: 30 mg via INTRAVENOUS

## 2013-06-28 MED ORDER — MIDAZOLAM HCL 5 MG/5ML IJ SOLN
INTRAMUSCULAR | Status: DC | PRN
  Administered 2013-06-28: 2 mg via INTRAVENOUS

## 2013-06-28 MED ORDER — DOCUSATE SODIUM 100 MG PO CAPS
100.0000 mg | ORAL_CAPSULE | Freq: Three times a day (TID) | ORAL | Status: DC
  Administered 2013-06-28 – 2013-06-30 (×7): 100 mg via ORAL
  Filled 2013-06-28 (×8): qty 1

## 2013-06-28 MED ORDER — PROPOFOL 10 MG/ML IV BOLUS
INTRAVENOUS | Status: DC | PRN
  Administered 2013-06-28: 150 mg via INTRAVENOUS

## 2013-06-28 MED ORDER — NEOSTIGMINE METHYLSULFATE 1 MG/ML IJ SOLN
INTRAMUSCULAR | Status: DC | PRN
  Administered 2013-06-28: 2 mg via INTRAVENOUS

## 2013-06-28 MED ORDER — DIPHENHYDRAMINE HCL 12.5 MG/5ML PO ELIX: 12.5000 mg | ORAL_SOLUTION | Freq: Four times a day (QID) | ORAL | Status: DC | PRN

## 2013-06-28 MED ORDER — ROCURONIUM BROMIDE 100 MG/10ML IV SOLN
INTRAVENOUS | Status: DC | PRN
  Administered 2013-06-28: 10 mg via INTRAVENOUS

## 2013-06-28 MED ORDER — ONDANSETRON HCL 4 MG/2ML IJ SOLN
INTRAMUSCULAR | Status: DC | PRN
  Administered 2013-06-28: 4 mg via INTRAVENOUS

## 2013-06-28 MED ORDER — MIDAZOLAM HCL 2 MG/2ML IJ SOLN
INTRAMUSCULAR | Status: AC
  Filled 2013-06-28: qty 2

## 2013-06-28 MED ORDER — OXYCODONE HCL 5 MG PO TABS: 5.0000 mg | ORAL_TABLET | Freq: Once | ORAL | Status: DC | PRN

## 2013-06-28 MED ORDER — MORPHINE SULFATE 2 MG/ML IJ SOLN
1.0000 mg | INTRAMUSCULAR | Status: DC | PRN
Start: 2013-06-28 — End: 2013-07-01
  Filled 2013-06-28: qty 1

## 2013-06-28 MED ORDER — KCL IN DEXTROSE-NACL 20-5-0.45 MEQ/L-%-% IV SOLN
INTRAVENOUS | Status: DC
  Administered 2013-06-28: 03:00:00 via INTRAVENOUS
  Filled 2013-06-28 (×3): qty 1000

## 2013-06-28 MED ORDER — OXYCODONE HCL 5 MG/5ML PO SOLN: 5.0000 mg | Freq: Once | ORAL | Status: DC | PRN

## 2013-06-28 MED ORDER — BUPIVACAINE LIPOSOME 1.3 % IJ SUSP
20.0000 mL | INTRAMUSCULAR | Status: DC
  Filled 2013-06-28: qty 20

## 2013-06-28 MED ORDER — ONDANSETRON HCL 4 MG/2ML IJ SOLN: 4.0000 mg | Freq: Four times a day (QID) | INTRAMUSCULAR | Status: DC | PRN

## 2013-06-28 MED ORDER — DEXAMETHASONE SODIUM PHOSPHATE 4 MG/ML IJ SOLN
INTRAMUSCULAR | Status: AC
  Filled 2013-06-28: qty 1

## 2013-06-28 MED ORDER — FENTANYL CITRATE 0.05 MG/ML IJ SOLN
INTRAMUSCULAR | Status: DC | PRN
  Administered 2013-06-28: 50 ug via INTRAVENOUS

## 2013-06-28 MED ORDER — LIDOCAINE HCL (CARDIAC) 20 MG/ML IV SOLN
INTRAVENOUS | Status: AC
  Filled 2013-06-28: qty 5

## 2013-06-28 MED ORDER — FENTANYL CITRATE 0.05 MG/ML IJ SOLN
INTRAMUSCULAR | Status: AC
  Filled 2013-06-28: qty 5

## 2013-06-28 MED ORDER — ONDANSETRON HCL 4 MG/2ML IJ SOLN: 4.0000 mg | Freq: Once | INTRAMUSCULAR | Status: DC | PRN

## 2013-06-28 MED ORDER — KETOROLAC TROMETHAMINE 30 MG/ML IJ SOLN
INTRAMUSCULAR | Status: AC
  Filled 2013-06-28: qty 1

## 2013-06-28 MED ORDER — GLYCOPYRROLATE 0.2 MG/ML IJ SOLN
INTRAMUSCULAR | Status: AC
  Filled 2013-06-28: qty 2

## 2013-06-28 MED ORDER — MORPHINE SULFATE 2 MG/ML IJ SOLN
1.0000 mg | INTRAMUSCULAR | Status: DC | PRN
  Administered 2013-06-28 (×2): 4 mg via INTRAVENOUS
  Filled 2013-06-28 (×2): qty 2

## 2013-06-28 MED ORDER — KCL IN DEXTROSE-NACL 20-5-0.45 MEQ/L-%-% IV SOLN
INTRAVENOUS | Status: DC
  Administered 2013-06-28 – 2013-06-30 (×4): via INTRAVENOUS
  Filled 2013-06-28 (×5): qty 1000

## 2013-06-28 MED ORDER — ROCURONIUM BROMIDE 50 MG/5ML IV SOLN
INTRAVENOUS | Status: AC
  Filled 2013-06-28: qty 1

## 2013-06-28 MED ORDER — DOCUSATE SODIUM 100 MG PO CAPS
100.0000 mg | ORAL_CAPSULE | Freq: Two times a day (BID) | ORAL | Status: DC
  Administered 2013-06-28 – 2013-07-01 (×6): 100 mg via ORAL
  Filled 2013-06-28 (×5): qty 1

## 2013-06-28 MED ORDER — NEOSTIGMINE METHYLSULFATE 1 MG/ML IJ SOLN
INTRAMUSCULAR | Status: AC
  Filled 2013-06-28: qty 10

## 2013-06-28 MED ORDER — DEXAMETHASONE SODIUM PHOSPHATE 4 MG/ML IJ SOLN
INTRAMUSCULAR | Status: DC | PRN
  Administered 2013-06-28: 4 mg via INTRAVENOUS

## 2013-06-28 MED ORDER — HYDROMORPHONE HCL PF 1 MG/ML IJ SOLN
0.2500 mg | INTRAMUSCULAR | Status: DC | PRN
  Administered 2013-06-28 (×2): 0.5 mg via INTRAVENOUS

## 2013-06-28 MED ORDER — DIPHENHYDRAMINE HCL 50 MG/ML IJ SOLN: 12.5000 mg | Freq: Four times a day (QID) | INTRAMUSCULAR | Status: DC | PRN

## 2013-06-28 MED ORDER — GLYCOPYRROLATE 0.2 MG/ML IJ SOLN
INTRAMUSCULAR | Status: DC | PRN
  Administered 2013-06-28: .3 mg via INTRAVENOUS

## 2013-06-28 MED ORDER — SUCCINYLCHOLINE CHLORIDE 20 MG/ML IJ SOLN
INTRAMUSCULAR | Status: DC | PRN
  Administered 2013-06-28: 160 mg via INTRAVENOUS

## 2013-06-28 MED ORDER — HYDROCODONE-ACETAMINOPHEN 10-325 MG PO TABS
1.0000 | ORAL_TABLET | ORAL | Status: DC | PRN
  Administered 2013-06-29 (×2): 2 via ORAL
  Administered 2013-06-30 (×3): 1 via ORAL
  Filled 2013-06-28: qty 2
  Filled 2013-06-28: qty 1
  Filled 2013-06-28: qty 2
  Filled 2013-06-28 (×2): qty 1

## 2013-06-28 MED ORDER — HYDROMORPHONE HCL PF 1 MG/ML IJ SOLN
INTRAMUSCULAR | Status: AC
  Filled 2013-06-28: qty 1

## 2013-06-28 MED ORDER — LIDOCAINE HCL (CARDIAC) 20 MG/ML IV SOLN
INTRAVENOUS | Status: DC | PRN
  Administered 2013-06-28: 50 mg via INTRAVENOUS

## 2013-06-28 MED ORDER — ONDANSETRON HCL 4 MG/2ML IJ SOLN
INTRAMUSCULAR | Status: AC
  Filled 2013-06-28: qty 2

## 2013-06-28 SURGICAL SUPPLY — 45 items
BANDAGE CONFORM 3  STR LF (GAUZE/BANDAGES/DRESSINGS) ×1 IMPLANT
BLADE SURG 15 STRL LF DISP TIS (BLADE) ×1 IMPLANT
BLADE SURG 15 STRL SS (BLADE) ×2
CANISTER SUCTION 2500CC (MISCELLANEOUS) ×2 IMPLANT
COVER SURGICAL LIGHT HANDLE (MISCELLANEOUS) ×2 IMPLANT
DRAIN PENROSE 1/2X12 LTX STRL (WOUND CARE) ×1 IMPLANT
DRAPE UTILITY 15X26 W/TAPE STR (DRAPE) ×4 IMPLANT
DRSG PAD ABDOMINAL 8X10 ST (GAUZE/BANDAGES/DRESSINGS) ×2 IMPLANT
ELECT CAUTERY BLADE 6.4 (BLADE) ×2 IMPLANT
ELECT REM PT RETURN 9FT ADLT (ELECTROSURGICAL) ×2
ELECTRODE REM PT RTRN 9FT ADLT (ELECTROSURGICAL) ×1 IMPLANT
GAUZE PACKING FOLDED 1/2 STR (GAUZE/BANDAGES/DRESSINGS) ×1 IMPLANT
GAUZE PACKING IODOFORM 1 (PACKING) IMPLANT
GLOVE BIOGEL M STRL SZ7.5 (GLOVE) ×1 IMPLANT
GLOVE BIOGEL PI IND STRL 7.5 (GLOVE) IMPLANT
GLOVE BIOGEL PI INDICATOR 7.5 (GLOVE) ×1
GLOVE SURG SIGNA 7.5 PF LTX (GLOVE) ×2 IMPLANT
GOWN STRL REUS W/ TWL LRG LVL3 (GOWN DISPOSABLE) ×1 IMPLANT
GOWN STRL REUS W/ TWL XL LVL3 (GOWN DISPOSABLE) ×1 IMPLANT
GOWN STRL REUS W/TWL LRG LVL3 (GOWN DISPOSABLE) ×2
GOWN STRL REUS W/TWL XL LVL3 (GOWN DISPOSABLE) ×2
KIT BASIN OR (CUSTOM PROCEDURE TRAY) ×2 IMPLANT
KIT ROOM TURNOVER OR (KITS) ×2 IMPLANT
NDL 18GX1X1/2 (RX/OR ONLY) (NEEDLE) IMPLANT
NDL HYPO 25GX1X1/2 BEV (NEEDLE) IMPLANT
NEEDLE 18GX1X1/2 (RX/OR ONLY) (NEEDLE) ×2 IMPLANT
NEEDLE HYPO 25GX1X1/2 BEV (NEEDLE) ×2 IMPLANT
NS IRRIG 1000ML POUR BTL (IV SOLUTION) ×2 IMPLANT
PACK LITHOTOMY IV (CUSTOM PROCEDURE TRAY) ×2 IMPLANT
PAD ABD 8X10 STRL (GAUZE/BANDAGES/DRESSINGS) ×5 IMPLANT
PAD ARMBOARD 7.5X6 YLW CONV (MISCELLANEOUS) ×2 IMPLANT
PENCIL BUTTON HOLSTER BLD 10FT (ELECTRODE) ×2 IMPLANT
SPONGE GAUZE 4X4 12PLY (GAUZE/BANDAGES/DRESSINGS) ×2 IMPLANT
SPONGE GAUZE 4X4 12PLY STER LF (GAUZE/BANDAGES/DRESSINGS) ×1 IMPLANT
SPONGE LAP 18X18 X RAY DECT (DISPOSABLE) ×2 IMPLANT
SUT ETHILON 2 0 FS 18 (SUTURE) ×1 IMPLANT
SWAB COLLECTION DEVICE MRSA (MISCELLANEOUS) ×1 IMPLANT
SYR BULB 3OZ (MISCELLANEOUS) ×2 IMPLANT
SYR CONTROL 10ML LL (SYRINGE) ×2 IMPLANT
TOWEL OR 17X24 6PK STRL BLUE (TOWEL DISPOSABLE) ×2 IMPLANT
TOWEL OR 17X26 10 PK STRL BLUE (TOWEL DISPOSABLE) ×2 IMPLANT
TUBE ANAEROBIC SPECIMEN COL (MISCELLANEOUS) IMPLANT
TUBE CONNECTING 12X1/4 (SUCTIONS) ×2 IMPLANT
UNDERPAD 30X30 INCONTINENT (UNDERPADS AND DIAPERS) ×2 IMPLANT
YANKAUER SUCT BULB TIP NO VENT (SUCTIONS) ×2 IMPLANT

## 2013-06-28 NOTE — Anesthesia Preprocedure Evaluation (Addendum)
Anesthesia Evaluation  Patient identified by MRN, date of birth, ID band Patient awake    Reviewed: Allergy & Precautions, H&P , NPO status , Patient's Chart, lab work & pertinent test results  Airway Mallampati: II TM Distance: >3 FB Neck ROM: Full    Dental  (+) Dental Advisory Given, Teeth Intact   Pulmonary sleep apnea ,  breath sounds clear to auscultation        Cardiovascular Rhythm:Regular Rate:Normal     Neuro/Psych    GI/Hepatic   Endo/Other    Renal/GU      Musculoskeletal   Abdominal   Peds  Hematology   Anesthesia Other Findings   Reproductive/Obstetrics                          Anesthesia Physical Anesthesia Plan  ASA: II  Anesthesia Plan: General   Post-op Pain Management:    Induction: Intravenous  Airway Management Planned: Oral ETT  Additional Equipment:   Intra-op Plan:   Post-operative Plan: Extubation in OR  Informed Consent: I have reviewed the patients History and Physical, chart, labs and discussed the procedure including the risks, benefits and alternatives for the proposed anesthesia with the patient or authorized representative who has indicated his/her understanding and acceptance.   Dental advisory given  Plan Discussed with: Anesthesiologist, Surgeon and CRNA  Anesthesia Plan Comments:        Anesthesia Quick Evaluation

## 2013-06-28 NOTE — Progress Notes (Deleted)
Pt. Ambulated approximately 144ft in hallway.  Was SOB but maintained oxygen sat. Between 94-96%.  Will continue to monitor. Syliva Overman

## 2013-06-28 NOTE — Progress Notes (Signed)
UR completed. Patient changed to inpatient- requiring IVF and IV antibiotics.  

## 2013-06-28 NOTE — Op Note (Signed)
IRRIGATION AND DEBRIDEMENT PERIRECTAL ABSCESS  Procedure Note  Antonio Harvey 06/27/2013 - 06/28/2013   Pre-op Diagnosis: Perirectal abcess     Post-op Diagnosis: same  Procedure(s): INCISION AND DRAINAGE PERIRECTAL ABSCESS  Surgeon(s): Harl Bowie, MD  Anesthesia: General  Staff:  Circulator: Nobie Putnam, RN; Hassell Halim, RN Scrub Person: Lockie Pares, RN  Estimated Blood Loss: Minimal                        [name]   Date: 06/28/2013  Time: 10:12 AM

## 2013-06-28 NOTE — Progress Notes (Signed)
Patient ID: Antonio Harvey, male   DOB: 02/28/1956, 58 y.o.   MRN: [id number]  Patient with deep perirectal abscess.  Plan I and D in the OR this morning.  I discussed the risks with him in detail including but not limited to bleeding, on going infection, wound packing, need for further surgery, continence issues, etc. He agrees to proceed.

## 2013-06-28 NOTE — Anesthesia Procedure Notes (Signed)
Procedure Name: Intubation Date/Time: 06/28/2013 9:38 AM Performed by: Carola Frost Pre-anesthesia Checklist: Patient identified, Timeout performed, Emergency Drugs available, Suction available and Patient being monitored Patient Re-evaluated:Patient Re-evaluated prior to inductionOxygen Delivery Method: Circle system utilized Preoxygenation: Pre-oxygenation with 100% oxygen Intubation Type: IV induction Ventilation: Mask ventilation without difficulty Laryngoscope Size: Mac, 4, Miller and 3 Grade View: Grade IV Tube type: Oral Tube size: 7.5 mm Number of attempts: 3 Airway Equipment and Method: Stylet and Video-laryngoscopy Placement Confirmation: CO2 detector,  positive ETCO2,  ETT inserted through vocal cords under direct vision and breath sounds checked- equal and bilateral Secured at: 23 cm Tube secured with: Tape Dental Injury: Teeth and Oropharynx as per pre-operative assessment  Difficulty Due To: Difficulty was unanticipated and Difficult Airway- due to immobile epiglottis

## 2013-06-28 NOTE — Transfer of Care (Signed)
Immediate Anesthesia Transfer of Care Note  Patient: Antonio Harvey  Procedure(s) Performed: Procedure(s): IRRIGATION AND DEBRIDEMENT PERIRECTAL ABSCESS (N/A)  Patient Location: PACU  Anesthesia Type:General  Level of Consciousness: awake and alert   Airway & Oxygen Therapy: Patient Spontanous Breathing and Patient connected to face mask oxygen  Post-op Assessment: Report given to PACU RN, Post -op Vital signs reviewed and stable and Patient moving all extremities X 4  Post vital signs: Reviewed and stable  Complications: No apparent anesthesia complications

## 2013-06-28 NOTE — Anesthesia Postprocedure Evaluation (Signed)
  Anesthesia Post-op Note  Patient: Antonio Harvey  Procedure(s) Performed: Procedure(s): IRRIGATION AND DEBRIDEMENT PERIRECTAL ABSCESS (N/A)  Patient Location: PACU  Anesthesia Type:General  Level of Consciousness: awake, alert  and oriented  Airway and Oxygen Therapy: Patient Spontanous Breathing and Patient connected to nasal cannula oxygen  Post-op Pain: mild  Post-op Assessment: Post-op Vital signs reviewed, Patient's Cardiovascular Status Stable, Respiratory Function Stable, Patent Airway, No signs of Nausea or vomiting and Pain level controlled  Post-op Vital Signs: stable  Complications: No apparent anesthesia complications

## 2013-06-28 NOTE — Preoperative (Signed)
Beta Blockers   Reason not to administer Beta Blockers:Not Applicable 

## 2013-06-29 NOTE — Progress Notes (Signed)
Patient ID: Antonio Harvey, male   DOB: October 25, 1955, 58 y.o.   MRN: [id number] [facility] Progress Note:   1 Day Post-Op  Subjective: Mental status is clear.   Objective: Vital signs in last 24 hours: Temp:  [97.1 F (36.2 C)-101.1 F (38.4 C)] 97.6 F (36.4 C) (03/14 0529) Pulse Rate:  [63-91] 63 (03/14 0529) Resp:  [11-27] 16 (03/14 0529) BP: (95-129)/(42-69) 107/58 mmHg (03/14 0529) SpO2:  [91 %-99 %] 96 % (03/14 0529)  Intake/Output from previous day: 03/13 0701 - 03/14 0700 In: 1783.3 [P.O.:340; I.V.:1443.3] Out: 425 [Urine:400; Blood:25] Intake/Output this shift:    Physical Exam: Work of breathing is normal.  Anus is not red.  No purulent drainage noted.  Packing removed and penrose remains  Lab Results:  Results for orders placed during the hospital encounter of 06/27/13 (from the past 48 hour(s))  CBC WITH DIFFERENTIAL     Status: Abnormal   Collection Time    06/27/13  7:19 PM      Result Value Ref Range   WBC 17.2 (*) 4.0 - 10.5 K/uL   RBC 5.31  4.22 - 5.81 MIL/uL   Hemoglobin 12.8 (*) 13.0 - 17.0 g/dL   HCT 37.4 (*) 39.0 - 52.0 %   MCV 70.4 (*) 78.0 - 100.0 fL   MCH 24.1 (*) 26.0 - 34.0 pg   MCHC 34.2  30.0 - 36.0 g/dL   RDW 14.6  11.5 - 15.5 %   Platelets 225  150 - 400 K/uL   Neutrophils Relative % 77  43 - 77 %   Lymphocytes Relative 12  12 - 46 %   Monocytes Relative 11  3 - 12 %   Eosinophils Relative 0  0 - 5 %   Basophils Relative 0  0 - 1 %   Neutro Abs 13.2 (*) 1.7 - 7.7 K/uL   Lymphs Abs 2.1  0.7 - 4.0 K/uL   Monocytes Absolute 1.9 (*) 0.1 - 1.0 K/uL   Eosinophils Absolute 0.0  0.0 - 0.7 K/uL   Basophils Absolute 0.0  0.0 - 0.1 K/uL   RBC Morphology POLYCHROMASIA PRESENT     WBC Morphology TOXIC GRANULATION    COMPREHENSIVE METABOLIC PANEL     Status: Abnormal   Collection Time    06/27/13  7:19 PM      Result Value Ref Range   Sodium 135 (*) 137 - 147 mEq/L   Potassium 4.7  3.7 - 5.3 mEq/L   Chloride 93 (*) 96 - 112 mEq/L   CO2 25  19 - 32 mEq/L   Glucose, Bld 92  70 - 99 mg/dL   BUN 16  6 - 23 mg/dL   Creatinine, Ser 1.53 (*) 0.50 - 1.35 mg/dL   Calcium 9.4  8.4 - 10.5 mg/dL   Total Protein 7.9  6.0 - 8.3 g/dL   Albumin 3.6  3.5 - 5.2 g/dL   AST 30  0 - 37 U/L   ALT 39  0 - 53 U/L   Alkaline Phosphatase 119 (*) 39 - 117 U/L   Total Bilirubin 0.6  0.3 - 1.2 mg/dL   GFR calc non Af Amer 48 (*) >90 mL/min   GFR calc Af Amer 56 (*) >90 mL/min   Comment: (NOTE)     The eGFR has been calculated using the CKD EPI equation.     This calculation has not been validated in all clinical situations.     eGFR's persistently <90 mL/min  signify possible Chronic Kidney     Disease.  I-STAT CG4 LACTIC ACID, ED     Status: None   Collection Time    06/27/13  7:34 PM      Result Value Ref Range   Lactic Acid, Venous 1.05  0.5 - 2.2 mmol/L  SURGICAL PCR SCREEN     Status: None   Collection Time    06/28/13  1:03 AM      Result Value Ref Range   MRSA, PCR NEGATIVE  NEGATIVE   Staphylococcus aureus NEGATIVE  NEGATIVE   Comment:            The Xpert SA Assay (FDA     approved for NASAL specimens     in patients over 34 years of age),     is one component of     a comprehensive surveillance     program.  Test performance has     been validated by [employer] for patients greater     than or equal to 41 year old.     It is not intended     to diagnose infection nor to     guide or monitor treatment.  CULTURE, ROUTINE-ABSCESS     Status: None   Collection Time    06/28/13  9:56 AM      Result Value Ref Range   Specimen Description ABSCESS ANAL     Special Requests PT ON ZOSYN     Gram Stain       Value: NO WBC SEEN     NO SQUAMOUS EPITHELIAL CELLS SEEN     NO ORGANISMS SEEN     Performed at [facility]   Culture       Value: Culture reincubated for better growth     Performed at [facility]   Report Status PENDING      Radiology/Results: Ct Abdomen Pelvis W Contrast  06/27/2013    CLINICAL DATA:  Rectal pain.  EXAM: CT ABDOMEN AND PELVIS WITH CONTRAST  TECHNIQUE: Multidetector CT imaging of the abdomen and pelvis was performed using the standard protocol following bolus administration of intravenous contrast.  CONTRAST:  154m OMNIPAQUE IOHEXOL 300 MG/ML  SOLN  COMPARISON:  None.  FINDINGS: Visualized lung bases appear normal. No significant osseous abnormality is noted.  No gallstones are noted. The liver spleen and pancreas appear normal. Adrenal glands appear normal. No hydronephrosis or renal obstruction is noted. No renal or ureteral calculi are noted. The appendix appears normal. Mild urinary bladder distention is noted. There is no evidence of bowel obstruction. Inflammatory changes are seen surrounding the rectum, with multiple small peripherally enhancing fluid collections seen in the perirectal soft tissues concerning for abscesses. The largest measures 40 x 24 x 23 mm. Stool is noted throughout the colon. No significant adenopathy is noted.  IMPRESSION: Mild urinary bladder distention is noted.  Inflammatory changes are seen surrounding the rectum with at least to peripherally enhancing fluid collections seen in the perirectal soft tissues consistent with abscess Critical Value/emergent results were called by telephone at the time of interpretation on 06/27/2013 at 11:47 PM to Dr. DRipley Fraise, who verbally acknowledged these results. .   Electronically Signed   By: JSabino DickM.D.   On: 06/27/2013 23:48    Anti-infectives: Anti-infectives   Start     Dose/Rate Route Frequency Ordered Stop   06/28/13 0100  piperacillin-tazobactam (ZOSYN) IVPB 3.375 g  3.375 g 12.5 mL/hr over 240 Minutes Intravenous 3 times per day 06/28/13 0024     06/27/13 2015  Ampicillin-Sulbactam (UNASYN) 3 g in sodium chloride 0.9 % 100 mL IVPB     3 g 100 mL/hr over 60 Minutes Intravenous  Once 06/27/13 2010 06/27/13 2149      Assessment/Plan: Problem List: Patient Active Problem List    Diagnosis Date Noted  . Perirectal abscess 06/27/2013  . Tinnitus 04/26/2013  . Hyperlipidemia 01/25/2011  . Sleep apnea 01/25/2011   Begin Sitz baths 1 Day Post-Op    LOS: 2 days   Matt B. Hassell Done, MD, [facility] [phone] beeper [phone]  06/29/2013 8:21 AM

## 2013-06-29 NOTE — ED Provider Notes (Signed)
I have personally seen and examined the patient.  I have discussed the plan of care with the resident.  I have reviewed the documentation on PMH/FH/Soc. History.  I have reviewed the documentation of the resident and agree.  D/w dr Barry Dienes, surgeon, pt to be admitted for operative repair.  Pt stable in the ED  Sharyon Cable, MD 06/29/13 [phone]

## 2013-06-29 NOTE — Op Note (Signed)
NAME:  [name], [name] NO.:  [id number]  MEDICAL RECORD NO.:  [id number]  LOCATION:  6N30C                        FACILITY:  [facility]  PHYSICIAN:  Coralie Keens, M.D. DATE OF BIRTH:  [birth year]  DATE OF PROCEDURE:  06/28/2013 DATE OF DISCHARGE:                              OPERATIVE REPORT   PREOPERATIVE DIAGNOSIS:  Perirectal abscess.  POSTOPERATIVE DIAGNOSIS:  Perirectal abscess.  PROCEDURE:  Incision and drainage of perirectal abscess.  SURGEON:  Coralie Keens, M.D.  ANESTHESIA:  General and injectable Exparel.  ESTIMATED BLOOD LOSS:  Minimal.  INDICATIONS:  This is a 58 year old gentleman who presents with a horseshoe deep perirectal abscess.  Decision was made to proceed to the operating room for incision and drainage.  FINDINGS:  The patient was found to have a very deep abscess in the left posterolateral perirectal area.  PROCEDURE IN DETAIL:  The patient was brought to the operating room and identified as Antonio Harvey.  He was placed supine on operating room table and general anesthesia was induced.  He was then placed in lithotomy position.  His perianal area was then prepped and draped in usual sterile fashion.  I inserted retractor into the anal canal.  The patient had a posterior anal fissure.  The abscess could not be palpated either internally or externally.  I inserted an 18-gauge needle into the area __________ CAT scan and found a pocket of purulence.  I then made a perianal incision with a scalpel.  I dissected down with a hemostat and found a large abscess cavity, which was posterior and horseshoe in shape.  I drained a large amount of purulence out of this and obtained cultures.  I then made a counter incision on the skin and placed a half- inch Penrose drain through this incision and down in the abscess cavity and then back out.  I then sutured the Penrose drain to itself with a 2- 0 nylon suture.  I then thoroughly irrigated the  abscess cavity with saline.  I injected Exparel circumferentially around the incision.  I then packed the wound with an inch Kling.  Gauze and tape were then applied. The patient tolerated the procedure well.  All the counts were correct at the end of procedure.  The patient was then extubated in operating room and taken in stable condition to the recovery room.     Coralie Keens, M.D.     DB/MEDQ  D:  06/28/2013  T:  06/28/2013  Job:  [id number]

## 2013-06-30 MED ORDER — MAGNESIUM HYDROXIDE 400 MG/5ML PO SUSP: 5.0000 mL | Freq: Every day | ORAL | Status: DC | PRN

## 2013-06-30 NOTE — Progress Notes (Signed)
Patient ID: Antonio Harvey, male   DOB: Mar 27, 1956, 58 y.o.   MRN: [id number] [facility] Progress Note:   2 Days Post-Op  Subjective: Mental status is clear.  Began showers yesterday.  Doesn't feel ready for discharge today.  Objective: Vital signs in last 24 hours: Temp:  [97.8 F (36.6 C)-98.3 F (36.8 C)] 97.8 F (36.6 C) (03/15 0522) Pulse Rate:  [62-75] 62 (03/15 0522) Resp:  [18-19] 18 (03/15 0522) BP: (99-115)/(54-59) 99/59 mmHg (03/15 0522) SpO2:  [98 %-100 %] 100 % (03/15 0522)  Intake/Output from previous day: 03/14 0701 - 03/15 0700 In: 2522.5 [P.O.:940; I.V.:1232.5; IV Piggyback:350] Out: -  Intake/Output this shift:    Physical Exam: Work of breathing is normal.  Penrose drain removed.  No erythema  Lab Results:  Results for orders placed during the hospital encounter of 06/27/13 (from the past 48 hour(s))  CULTURE, ROUTINE-ABSCESS     Status: None   Collection Time    06/28/13  9:56 AM      Result Value Ref Range   Specimen Description ABSCESS ANAL     Special Requests PT ON ZOSYN     Gram Stain       Value: NO WBC SEEN     NO SQUAMOUS EPITHELIAL CELLS SEEN     NO ORGANISMS SEEN     Performed at [facility]   Culture       Value: [place]     Performed at [facility]   Report Status PENDING      Radiology/Results: No results found.  Anti-infectives: Anti-infectives   Start     Dose/Rate Route Frequency Ordered Stop   06/28/13 0100  piperacillin-tazobactam (ZOSYN) IVPB 3.375 g     3.375 g 12.5 mL/hr over 240 Minutes Intravenous 3 times per day 06/28/13 0024     06/27/13 2015  Ampicillin-Sulbactam (UNASYN) 3 g in sodium chloride 0.9 % 100 mL IVPB     3 g 100 mL/hr over 60 Minutes Intravenous  Once 06/27/13 2010 06/27/13 2149      Assessment/Plan: Problem List: Patient Active Problem List   Diagnosis Date Noted  . Perirectal abscess 06/27/2013  . Tinnitus 04/26/2013  . Hyperlipidemia 01/25/2011  .  Sleep apnea 01/25/2011    Will continue IV antibiotics for one more day.  Hopeful discharge tomorrow.  2 Days Post-Op    LOS: 3 days   Matt B. Hassell Done, MD, [facility] [phone] beeper [phone]  06/30/2013 7:35 AM

## 2013-07-01 ENCOUNTER — Ambulatory Visit: Payer: BC Managed Care – PPO | Admitting: Medical

## 2013-07-01 ENCOUNTER — Telehealth (INDEPENDENT_AMBULATORY_CARE_PROVIDER_SITE_OTHER): Payer: Self-pay | Admitting: General Surgery

## 2013-07-01 LAB — CULTURE, ROUTINE-ABSCESS: GRAM STAIN: NONE SEEN

## 2013-07-01 MED ORDER — AMOXICILLIN-POT CLAVULANATE 875-125 MG PO TABS: 1.0000 | ORAL_TABLET | Freq: Two times a day (BID) | ORAL | Status: DC

## 2013-07-01 MED ORDER — HYDROCODONE-ACETAMINOPHEN 10-325 MG PO TABS: 1.0000 | ORAL_TABLET | Freq: Four times a day (QID) | ORAL | Status: DC | PRN

## 2013-07-01 MED ORDER — DSS 100 MG PO CAPS: 100.0000 mg | ORAL_CAPSULE | Freq: Two times a day (BID) | ORAL | Status: DC | PRN

## 2013-07-01 NOTE — Telephone Encounter (Signed)
LMOM for patient to call back and ask for [facility]. I need to make an apt with Dr Ninfa Linden, patient has a Horseshoe perirectal abscess (s/p I & D). Patient has a penrose drain is in place

## 2013-07-01 NOTE — Discharge Summary (Signed)
discharge

## 2013-07-01 NOTE — Discharge Summary (Signed)
Physician Discharge Summary  Patient ID: Antonio Harvey MRN: [id number] DOB/AGE: 12-23-1955 58 y.o.  Admit date: 06/27/2013 Discharge date: 07/01/2013  Admitting Diagnosis: Perirectal abscess  Discharge Diagnosis Patient Active Problem List   Diagnosis Date Noted  . Perirectal abscess 06/27/2013  . Tinnitus 04/26/2013  . Hyperlipidemia 01/25/2011  . Sleep apnea 01/25/2011    Consultants None  Imaging: No results found.  Procedures Dr. Ninfa Linden (06/28/13) - Incision and drainage of perirectal abscess    Hospital Course:  Antonio Harvey is a 58 y.o. male who presented to [facility] with rectal abscess. Pt had been to his PCP that day after 1 week of perirectal pain.  Workup showed horseshoe perirectal abscess and pt was advised to report to [facility] for further evaluation and management. Pt reported fever and chills, and denied relief from sitz baths and hemorrhoid cream at home. Pt also reported constipation secondary to rectal pain.  Patient was admitted to surgery floor and underwent procedure listed above.  Tolerated procedure well and was transferred to the floor.  Diet was advanced as tolerated.  The patient was kept in the hospital in IV antibiotics for a few days as his pain improved and became controlled on oral pain meds.  On POD#3, the patient was voiding well, tolerating diet, ambulating well, pain well controlled, vital signs stable, showering without difficulty, using sitz baths, and felt stable for discharge home.  Patient will follow up in our office in 2-3 weeks and knows to call with questions or concerns.  He will be sent home with 1 week of Augmentin. He is encouraged to take a stool softener and continue with sitz bath with epsom salts.   Physical Exam: Vitals:  Filed Vitals:   07/01/13 0524  BP: 107/69  Pulse: 63  Temp: 97.9 F (36.6 C)  Resp: 18  General: WDWN male in NAD Cor: RRR. No M/G/R noted. Normal S1 and S2.  Lungs: CTA bilaterally. Non tender. Normal  Effort.  Abdomen: Soft, NT/ND.  + BS. No masses, hernias, or organomegaly. Buttocks: Small wound to right buttock as well as in the midline.  Minimal drainage on dressing. Minimally tender, with no fluctuance or induration.    Medication List    STOP taking these medications       hydrocortisone 2.5 % rectal cream  Commonly known as:  ANUSOL-HC      TAKE these medications       amoxicillin-clavulanate 875-125 MG per tablet  Commonly known as:  AUGMENTIN  Take 1 tablet by mouth 2 (two) times daily.     atorvastatin 20 MG tablet  Commonly known as:  LIPITOR  Take 10 mg by mouth daily.     DSS 100 MG Caps  Take 100 mg by mouth 2 (two) times daily as needed for mild constipation.     HYDROcodone-acetaminophen 10-325 MG per tablet  Commonly known as:  NORCO  Take 1-2 tablets by mouth every 6 (six) hours as needed (pain).         Follow-up Information   Follow up with [facility]. Schedule an appointment as soon as possible for a visit in 2 weeks. (For post hospital check up.)    Contact information:   [address] [place]   [place] [address] [phone]       Signed: Barrington Ellison, PA-S  [facility] [phone]  07/01/2013, 11:55 AM   ----------------------------------------------------------------------------------------------------------- General Surgery PA Preceptor Note:  I agree  with the above PA students findings as above.  Made changes above as needed.  Coralie Keens, PA-C General Surgery [facility] Pager: [phone] Office: [phone]

## 2013-07-01 NOTE — Progress Notes (Signed)
Went over discharge summary with patient. Patient understands home drug regimen and infection control measures. Went over when to call provider. Community resources reviewed and patient told to schedule follow up appointment as appropriate.

## 2013-07-02 ENCOUNTER — Encounter (HOSPITAL_COMMUNITY): Payer: Self-pay | Admitting: Surgery

## 2013-07-02 ENCOUNTER — Telehealth: Payer: Self-pay | Admitting: Medical

## 2013-07-02 NOTE — Telephone Encounter (Signed)
I received FMLA forms.  I haven't seen him in follow up from hospital.  This should probably be done by surgeon.  I have no idea if surgeon took him out of work, for how long he was taken out of work.  If surgeon gave him specific info on work/work absence, then surgeon needs the forms.  Please inquire.  May need recheck OV.

## 2013-07-02 NOTE — Telephone Encounter (Signed)
Patient is aware and he said that he will come back by the office and pick the forms up and take them to his surgeon's office to fill out. CLS

## 2013-07-15 ENCOUNTER — Encounter (INDEPENDENT_AMBULATORY_CARE_PROVIDER_SITE_OTHER): Payer: Self-pay | Admitting: Surgery

## 2013-07-15 ENCOUNTER — Ambulatory Visit (INDEPENDENT_AMBULATORY_CARE_PROVIDER_SITE_OTHER): Payer: BC Managed Care – PPO | Admitting: Surgery

## 2013-07-15 VITALS — BP 126/80 | HR 75 | Temp 97.8°F | Resp 16 | Ht 70.0 in | Wt 197.4 lb

## 2013-07-15 DIAGNOSIS — Z09 Encounter for follow-up examination after completed treatment for conditions other than malignant neoplasm: Secondary | ICD-10-CM

## 2013-07-15 MED ORDER — HYDROCODONE-ACETAMINOPHEN 10-325 MG PO TABS: 1.0000 | ORAL_TABLET | Freq: Four times a day (QID) | ORAL | Status: DC | PRN

## 2013-07-15 NOTE — Progress Notes (Signed)
Subjective:     Patient ID: Antonio Harvey, male   DOB: [birth year], 58 y.o.   MRN: [id number]  HPI He is here for his first postop visit status post incision and drainage of a deep perirectal abscess. He is doing well has mild discomfort.There is mild drainage  Review of Systems     Objective:   Physical Exam On exam, he still has an open wound which is healing well In the perirectal area.  There is no evidence of infection    Assessment:     Patient stable postop    Plan:     He will refrain from work until April 30 of this year. I will see him back as needed

## 2013-08-08 ENCOUNTER — Telehealth: Payer: Self-pay | Admitting: Family Medicine

## 2013-08-08 NOTE — Telephone Encounter (Signed)
Pt said his physician assistant from the [facility] hospital will be calling him to get a statement about his ear.  Please plan to speak with him.

## 2013-08-15 ENCOUNTER — Telehealth: Payer: Self-pay | Admitting: Family Medicine

## 2013-08-15 NOTE — Telephone Encounter (Signed)
Returned call to pt [phone] reached voicemail. [place]

## 2013-08-19 ENCOUNTER — Telehealth: Payer: Self-pay | Admitting: Family Medicine

## 2013-08-19 ENCOUNTER — Encounter: Payer: Self-pay | Admitting: Family Medicine

## 2013-08-19 NOTE — Telephone Encounter (Signed)
[place] with [facility] [phone]  Letter sent.  Pt will come by for a copy.

## 2013-11-21 ENCOUNTER — Ambulatory Visit (INDEPENDENT_AMBULATORY_CARE_PROVIDER_SITE_OTHER): Payer: BC Managed Care – PPO | Admitting: Family Medicine

## 2013-11-21 ENCOUNTER — Encounter: Payer: Self-pay | Admitting: Family Medicine

## 2013-11-21 VITALS — BP 124/78 | HR 74 | Wt 205.0 lb

## 2013-11-21 DIAGNOSIS — M1990 Unspecified osteoarthritis, unspecified site: Secondary | ICD-10-CM

## 2013-11-21 DIAGNOSIS — M129 Arthropathy, unspecified: Secondary | ICD-10-CM

## 2013-11-21 NOTE — Progress Notes (Signed)
   Subjective:    Patient ID: Antonio Harvey, male    DOB: 14-Jun-1955, 58 y.o.   MRN: [id number]  HPI He complains of a several week history of difficulty with several digits on his right hand. He describes inability to fully flex his third and fourth fingers. He has no toe with his wrists, elbows, knees or ankles. He is on no new medications.   Review of Systems     Objective:   Physical Exam Good motion of his hand and fingers. Slight joint swelling is noted in the third and fourth PIP as well as MCP joints.       Assessment & Plan:  Arthritis  recommend Tylenol and then inset of choice on an as-needed basis. Explained that he does have a slight effusion which I find interesting but no other joints. If he notes any other joints becoming inflamed, he is to return here for reevaluation.

## 2014-02-18 ENCOUNTER — Other Ambulatory Visit: Payer: Self-pay | Admitting: Family Medicine

## 2014-07-10 ENCOUNTER — Other Ambulatory Visit: Payer: Self-pay | Admitting: Family Medicine

## 2014-07-24 ENCOUNTER — Ambulatory Visit (INDEPENDENT_AMBULATORY_CARE_PROVIDER_SITE_OTHER): Payer: BC Managed Care – PPO | Admitting: Family Medicine

## 2014-07-24 ENCOUNTER — Encounter: Payer: Self-pay | Admitting: Family Medicine

## 2014-07-24 VITALS — BP 120/70 | HR 61 | Wt 207.0 lb

## 2014-07-24 DIAGNOSIS — G4730 Sleep apnea, unspecified: Secondary | ICD-10-CM | POA: Diagnosis not present

## 2014-07-24 DIAGNOSIS — E785 Hyperlipidemia, unspecified: Secondary | ICD-10-CM

## 2014-07-24 DIAGNOSIS — K5730 Diverticulosis of large intestine without perforation or abscess without bleeding: Secondary | ICD-10-CM | POA: Diagnosis not present

## 2014-07-24 DIAGNOSIS — H9313 Tinnitus, bilateral: Secondary | ICD-10-CM

## 2014-07-24 DIAGNOSIS — Z86010 Personal history of colonic polyps: Secondary | ICD-10-CM

## 2014-07-24 LAB — CBC WITH DIFFERENTIAL/PLATELET
BASOS ABS: 0 10*3/uL (ref 0.0–0.1)
Basophils Relative: 0 % (ref 0–1)
Eosinophils Absolute: 0.1 10*3/uL (ref 0.0–0.7)
Eosinophils Relative: 1 % (ref 0–5)
HCT: 44.1 % (ref 39.0–52.0)
HEMOGLOBIN: 14.2 g/dL (ref 13.0–17.0)
LYMPHS ABS: 2.2 10*3/uL (ref 0.7–4.0)
LYMPHS PCT: 32 % (ref 12–46)
MCH: 23.4 pg — AB (ref 26.0–34.0)
MCHC: 32.2 g/dL (ref 30.0–36.0)
MCV: 72.8 fL — AB (ref 78.0–100.0)
MPV: 9.4 fL (ref 8.6–12.4)
Monocytes Absolute: 0.4 10*3/uL (ref 0.1–1.0)
Monocytes Relative: 6 % (ref 3–12)
NEUTROS PCT: 61 % (ref 43–77)
Neutro Abs: 4.2 10*3/uL (ref 1.7–7.7)
Platelets: 126 10*3/uL — ABNORMAL LOW (ref 150–400)
RBC: 6.06 MIL/uL — ABNORMAL HIGH (ref 4.22–5.81)
RDW: 18.2 % — AB (ref 11.5–15.5)
WBC: 6.9 10*3/uL (ref 4.0–10.5)

## 2014-07-24 LAB — LIPID PANEL
CHOL/HDL RATIO: 3.9 ratio
CHOLESTEROL: 157 mg/dL (ref 0–200)
HDL: 40 mg/dL (ref >=40)
LDL Cholesterol: 102 mg/dL — ABNORMAL HIGH (ref 0–99)
TRIGLYCERIDES: 74 mg/dL (ref <150)
VLDL: 15 mg/dL (ref 0–40)

## 2014-07-24 LAB — COMPREHENSIVE METABOLIC PANEL
ALBUMIN: 4.6 g/dL (ref 3.5–5.2)
ALK PHOS: 88 U/L (ref 39–117)
ALT: 29 U/L (ref 0–53)
AST: 18 U/L (ref 0–37)
BUN: 12 mg/dL (ref 6–23)
CO2: 25 mEq/L (ref 19–32)
CREATININE: 1.12 mg/dL (ref 0.50–1.35)
Calcium: 9.1 mg/dL (ref 8.4–10.5)
Chloride: 102 mEq/L (ref 96–112)
GLUCOSE: 82 mg/dL (ref 70–99)
POTASSIUM: 3.8 meq/L (ref 3.5–5.3)
Sodium: 139 mEq/L (ref 135–145)
Total Bilirubin: 0.6 mg/dL (ref 0.2–1.2)
Total Protein: 7.5 g/dL (ref 6.0–8.3)

## 2014-07-24 MED ORDER — ATORVASTATIN CALCIUM 20 MG PO TABS: 10.0000 mg | ORAL_TABLET | Freq: Every day | ORAL | Status: DC

## 2014-07-24 NOTE — Progress Notes (Signed)
   Subjective:    Patient ID: Antonio Harvey, male    DOB: 06-May-1955, 59 y.o.   MRN: [id number]  HPI He is here for a medication check. Has no complaints and states he has been doing well. He has been taking his Lipitor daily. He continues to have bilateral tinnitus but is managing ok. He uses his CPAP nightly fo sleep apnea and states he feels pretty well rested. Denies fever, chills, fatigue, weight loss, chest pain, DOE, GI or GU issues. He had a colonoscopy in 2013 and did have a polyp. He has been eating relatively healthy, exercises rarely but has plans to increase his activity. He questions whether he should have a PSA test since he has a friend who was recently diagnosed with prostate cancer.  Medications, social history, immunizations and health maintenance reviewed.   Review of Systems     Objective:   Physical Exam  Alert and in no distress.Cardiac exam shows a regular sinus rhythm without murmurs or gallops. Lungs are clear to auscultation. Colonoscopy report was reviewed.      Assessment & Plan:  Hyperlipidemia - Plan: Lipid panel, atorvastatin (LIPITOR) 20 MG tablet  Sleep apnea  Tinnitus, bilateral  History of colonic polyps - Plan: CBC with Differential/Platelet, Comprehensive metabolic panel  Diverticulosis of colon without hemorrhage - Plan: CBC with Differential/Platelet, Comprehensive metabolic panel  Continue taking current medication and using CPAP. Discussed the latest guidelines for PSA testing and offered to order the test today, he declined and this is reasonable. Recommended getting a minimum of 150 minutes of physical activity per week and eating a well balanced diet.

## 2014-09-26 IMAGING — CT CT ABD-PELV W/ CM
2 of 5 series · 16 of 46 positions shown, 18 images · IV contrast (APPLIED)
Comparison: None.

CLINICAL DATA: Rectal pain.

EXAM:
CT ABDOMEN AND PELVIS WITH CONTRAST
TECHNIQUE: Multidetector CT imaging of the abdomen and pelvis was performed
using the standard protocol following bolus administration of
intravenous contrast.
CONTRAST:  100mL OMNIPAQUE IOHEXOL 300 MG/ML  SOLN

[Series 2: abd/ pelvis 5.0 i30f 1 · axial · 0.77mm/px · z∈[-418,+7]mm · 13 of 95 slices shown, 15 images]
[im 5/95  soft-tissue]
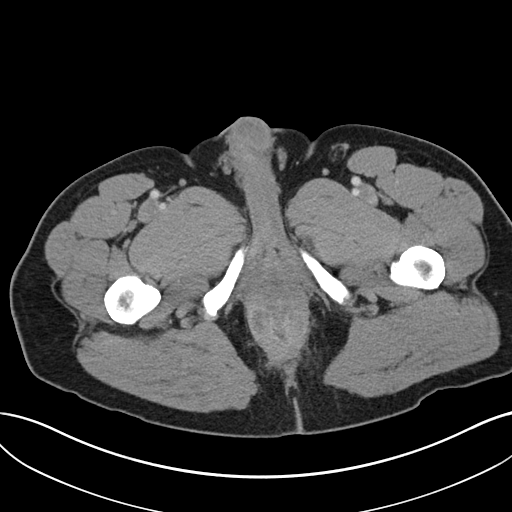
[im 5/95  bone]
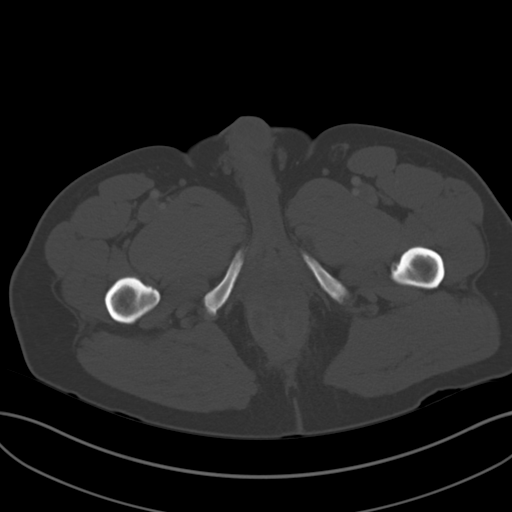
[im 14/95  soft-tissue]
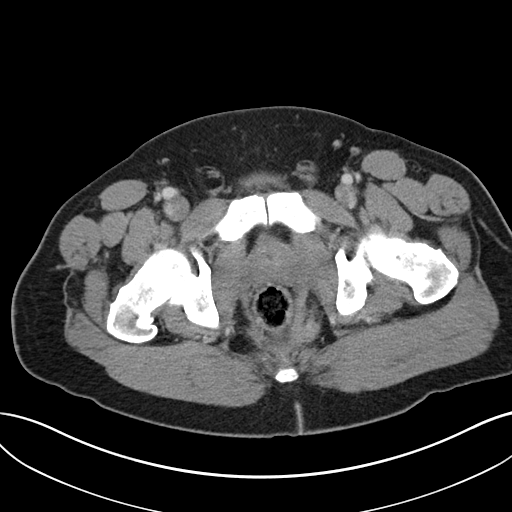
[im 18/95  soft-tissue]
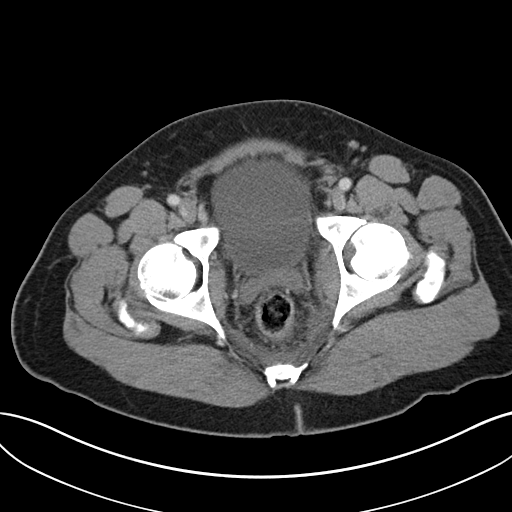
[im 27/95  soft-tissue]
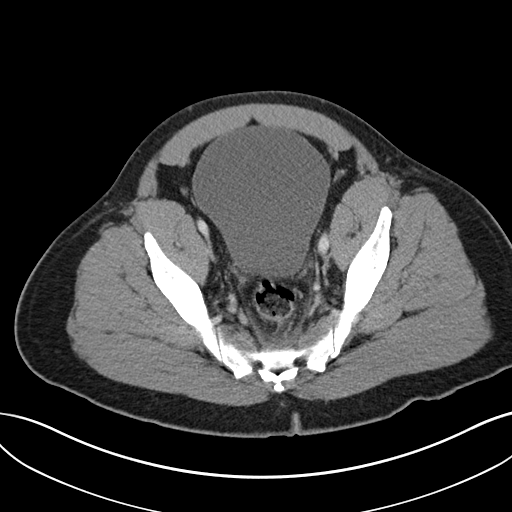
[im 32/95  soft-tissue]
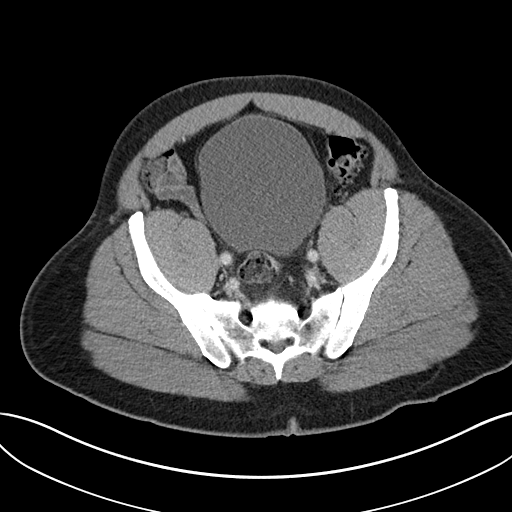
[im 41/95  soft-tissue]
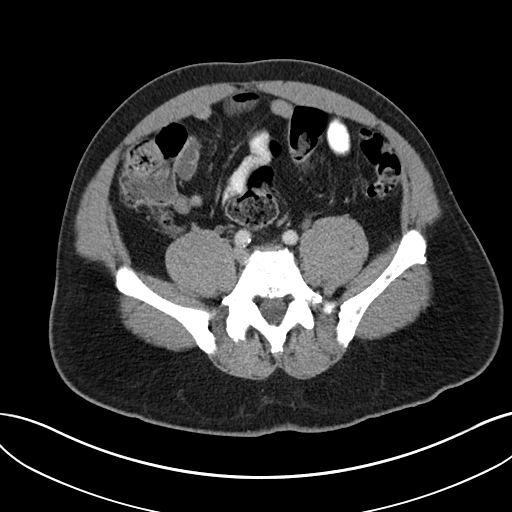
[im 50/95  soft-tissue]
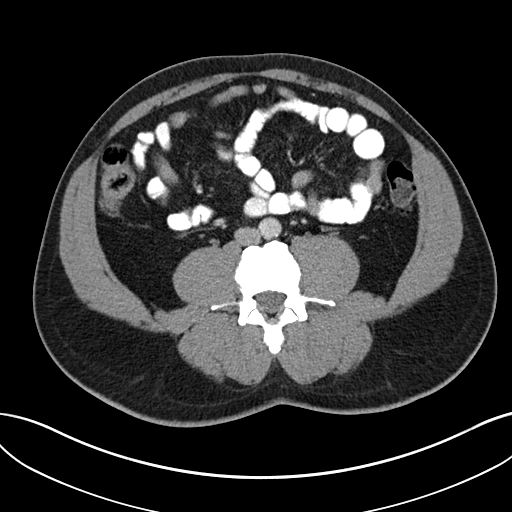
[im 54/95  soft-tissue]
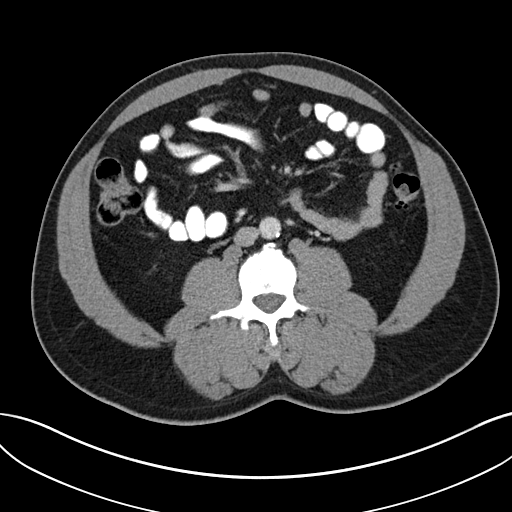
[im 63/95  soft-tissue]
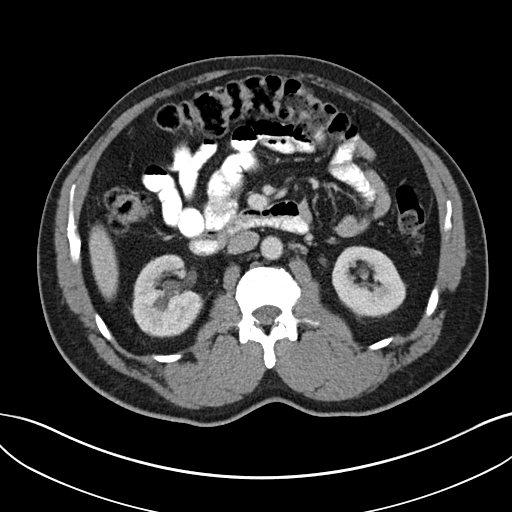
[im 63/95  bone]
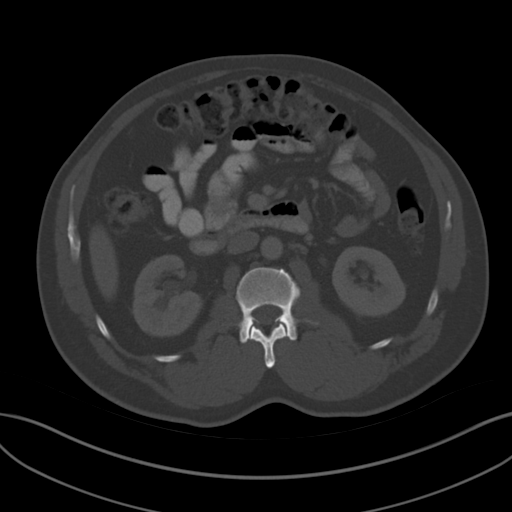
[im 68/95  soft-tissue]
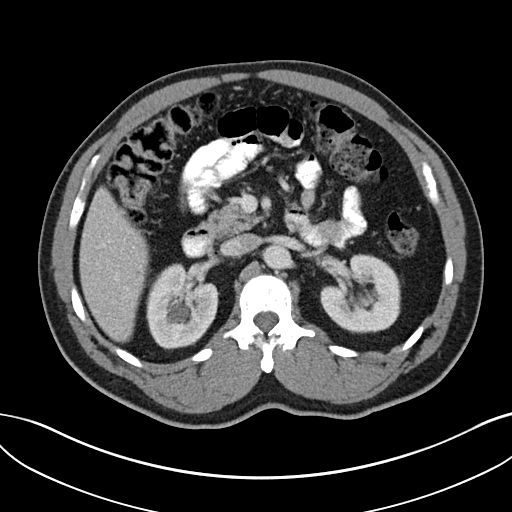
[im 77/95  soft-tissue]
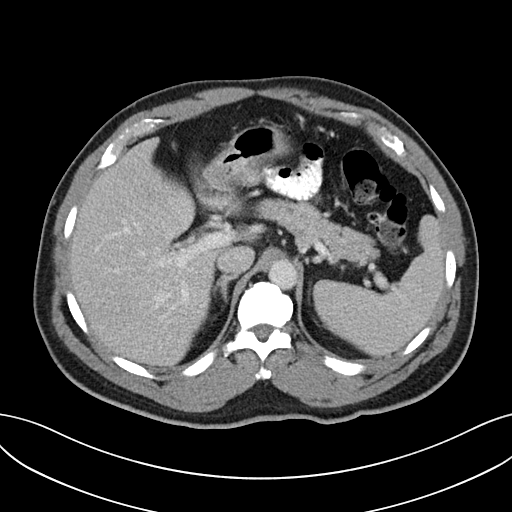
[im 81/95  soft-tissue]
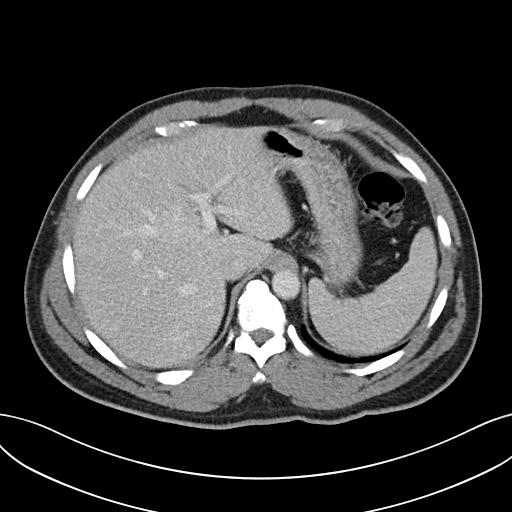
[im 90/95  soft-tissue]
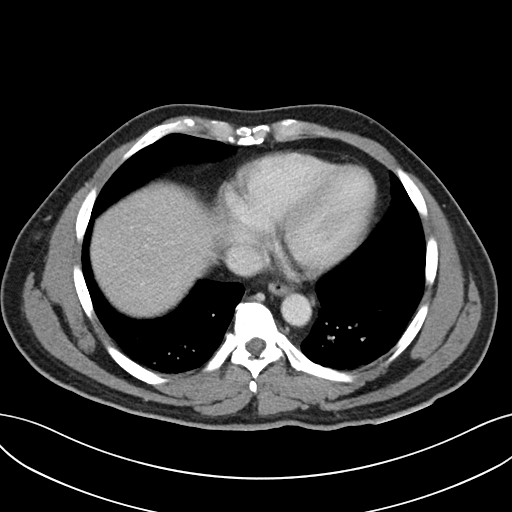

[Series 5: coronal soft tissue · coronal · 0.88mm/px · 3 of 88 slices shown]
[im 30/88  soft-tissue]
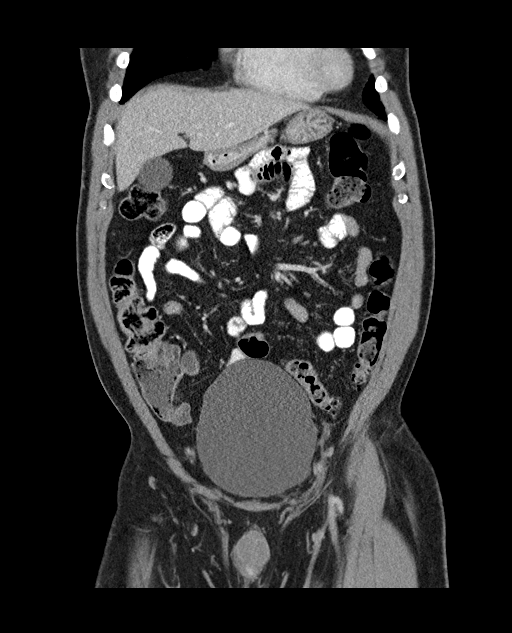
[im 39/88  soft-tissue]
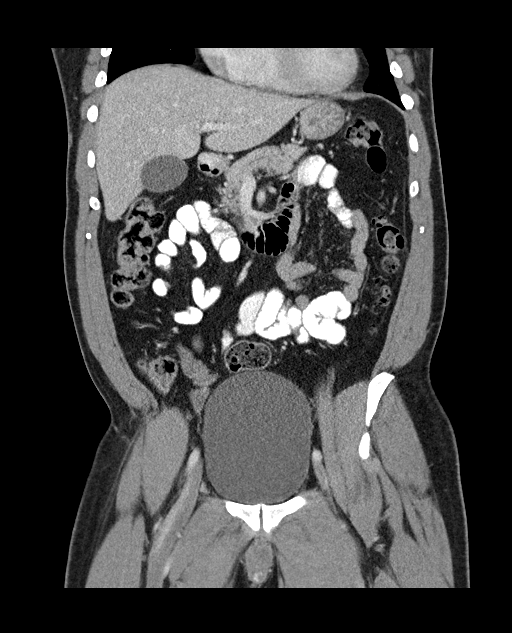
[im 49/88  soft-tissue]
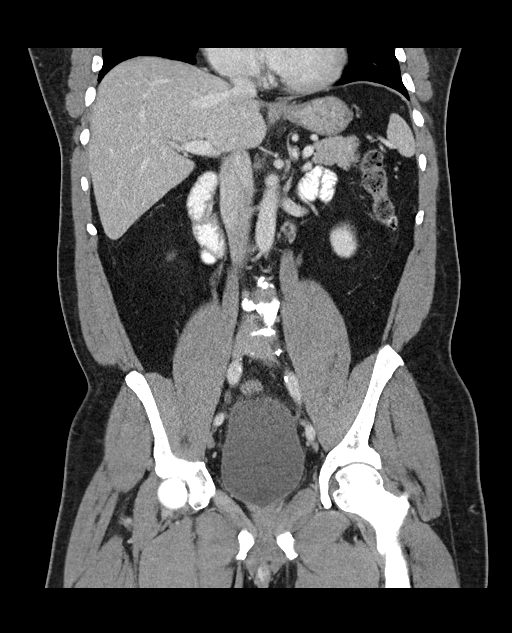

[16 of 46 positions shown; findings below may reference images not displayed]

FINDINGS: Visualized lung bases appear normal. No significant osseous
abnormality is noted.

No gallstones are noted. The liver spleen and pancreas appear
normal. Adrenal glands appear normal. No hydronephrosis or renal
obstruction is noted. No renal or ureteral calculi are noted. The
appendix appears normal. Mild urinary bladder distention is noted.
There is no evidence of bowel obstruction. Inflammatory changes are
seen surrounding the rectum, with multiple small peripherally
enhancing fluid collections seen in the perirectal soft tissues
concerning for abscesses. The largest measures 40 x 24 x 23 mm.
Stool is noted throughout the colon. No significant adenopathy is
noted.
IMPRESSION: Mild urinary bladder distention is noted.

Inflammatory changes are seen surrounding the rectum with at least
to peripherally enhancing fluid collections seen in the perirectal
soft tissues consistent with abscess Critical Value/emergent results
were called by telephone at the time of interpretation on 06/27/2013
at [DATE] to Dr. TORKU TARNAGDA , who verbally acknowledged these
results. .

## 2015-03-24 ENCOUNTER — Telehealth: Payer: Self-pay | Admitting: Family Medicine

## 2015-03-24 DIAGNOSIS — E785 Hyperlipidemia, unspecified: Secondary | ICD-10-CM

## 2015-03-24 NOTE — Telephone Encounter (Signed)
Pt made a cpe appt for first of January needs lipitor filled until then. [facility] uses [facility] [address].

## 2015-03-25 MED ORDER — ATORVASTATIN CALCIUM 20 MG PO TABS: 20.0000 mg | ORAL_TABLET | Freq: Every day | ORAL | Status: DC

## 2015-03-25 NOTE — Telephone Encounter (Signed)
Given 30 days supply to last until appt

## 2015-04-29 ENCOUNTER — Encounter: Payer: Self-pay | Admitting: Family Medicine

## 2015-04-29 ENCOUNTER — Ambulatory Visit (INDEPENDENT_AMBULATORY_CARE_PROVIDER_SITE_OTHER): Payer: BC Managed Care – PPO | Admitting: Family Medicine

## 2015-04-29 VITALS — BP 140/80 | HR 68 | Ht 70.0 in | Wt 211.4 lb

## 2015-04-29 DIAGNOSIS — M6789 Other specified disorders of synovium and tendon, multiple sites: Secondary | ICD-10-CM | POA: Diagnosis not present

## 2015-04-29 DIAGNOSIS — E785 Hyperlipidemia, unspecified: Secondary | ICD-10-CM | POA: Diagnosis not present

## 2015-04-29 DIAGNOSIS — Z860100 Personal history of colon polyps, unspecified: Secondary | ICD-10-CM

## 2015-04-29 DIAGNOSIS — Z23 Encounter for immunization: Secondary | ICD-10-CM | POA: Diagnosis not present

## 2015-04-29 DIAGNOSIS — M6749 Ganglion, multiple sites: Secondary | ICD-10-CM

## 2015-04-29 DIAGNOSIS — H9313 Tinnitus, bilateral: Secondary | ICD-10-CM | POA: Diagnosis not present

## 2015-04-29 DIAGNOSIS — G4730 Sleep apnea, unspecified: Secondary | ICD-10-CM

## 2015-04-29 DIAGNOSIS — M7139 Other bursal cyst, multiple sites: Secondary | ICD-10-CM

## 2015-04-29 DIAGNOSIS — H6123 Impacted cerumen, bilateral: Secondary | ICD-10-CM

## 2015-04-29 DIAGNOSIS — Z79899 Other long term (current) drug therapy: Secondary | ICD-10-CM | POA: Diagnosis not present

## 2015-04-29 DIAGNOSIS — L918 Other hypertrophic disorders of the skin: Secondary | ICD-10-CM | POA: Diagnosis not present

## 2015-04-29 LAB — COMPREHENSIVE METABOLIC PANEL
ALBUMIN: 4.6 g/dL (ref 3.6–5.1)
ALK PHOS: 93 U/L (ref 40–115)
ALT: 27 U/L (ref 9–46)
AST: 23 U/L (ref 10–35)
BILIRUBIN TOTAL: 0.6 mg/dL (ref 0.2–1.2)
BUN: 13 mg/dL (ref 7–25)
CO2: 26 mmol/L (ref 20–31)
Calcium: 9.4 mg/dL (ref 8.6–10.3)
Chloride: 101 mmol/L (ref 98–110)
Creat: 1.23 mg/dL (ref 0.70–1.25)
Glucose, Bld: 79 mg/dL (ref 65–99)
POTASSIUM: 3.8 mmol/L (ref 3.5–5.3)
Sodium: 138 mmol/L (ref 135–146)
Total Protein: 7.4 g/dL (ref 6.1–8.1)

## 2015-04-29 LAB — CBC WITH DIFFERENTIAL/PLATELET
BASOS ABS: 0 10*3/uL (ref 0.0–0.1)
Basophils Relative: 0 % (ref 0–1)
EOS PCT: 1 % (ref 0–5)
Eosinophils Absolute: 0.1 10*3/uL (ref 0.0–0.7)
HEMATOCRIT: 44.9 % (ref 39.0–52.0)
HEMOGLOBIN: 14.6 g/dL (ref 13.0–17.0)
LYMPHS ABS: 2.2 10*3/uL (ref 0.7–4.0)
Lymphocytes Relative: 32 % (ref 12–46)
MCH: 23.4 pg — ABNORMAL LOW (ref 26.0–34.0)
MCHC: 32.5 g/dL (ref 30.0–36.0)
MCV: 72 fL — ABNORMAL LOW (ref 78.0–100.0)
MONO ABS: 0.5 10*3/uL (ref 0.1–1.0)
MPV: 9.4 fL (ref 8.6–12.4)
Monocytes Relative: 7 % (ref 3–12)
NEUTROS ABS: 4.1 10*3/uL (ref 1.7–7.7)
Neutrophils Relative %: 60 % (ref 43–77)
Platelets: 149 10*3/uL — ABNORMAL LOW (ref 150–400)
RBC: 6.24 MIL/uL — AB (ref 4.22–5.81)
RDW: 17.4 % — AB (ref 11.5–15.5)
WBC: 6.8 10*3/uL (ref 4.0–10.5)

## 2015-04-29 LAB — LIPID PANEL
CHOLESTEROL: 181 mg/dL (ref 125–200)
HDL: 39 mg/dL — AB (ref >=40)
LDL Cholesterol: 117 mg/dL (ref <130)
TRIGLYCERIDES: 125 mg/dL (ref <150)
Total CHOL/HDL Ratio: 4.6 Ratio (ref <=5.0)
VLDL: 25 mg/dL (ref <30)

## 2015-04-29 MED ORDER — ATORVASTATIN CALCIUM 20 MG PO TABS: 20.0000 mg | ORAL_TABLET | Freq: Every day | ORAL | Status: DC

## 2015-04-29 NOTE — Progress Notes (Signed)
Subjective:    Patient ID: Antonio Harvey, male    DOB: [birth year], 60 y.o.   MRN: [id number]  HPI He is here for complete examination he does have some slight difficulty with his left third finger. He does have a knot-like lesion in the hand which interferes with certain activities causing pain. He also has lesions on his neck and in his left axilla that he would like evaluated. He also complains of decreased hearing and feels he has plugged up canals. He is taken care of at the [place]. He does have sleep apnea and Is being followed there for this. He also has a history of hyperlipidemia and presently is on Lipitor. He continues have ringing in his ears. He has had a colonoscopy which did show colonic polyps. He's had no difficulty with chest pain, allergy symptoms, BPH related symptoms. Family and social history as well as health maintenance and immunizations were reviewed. His marriage is going well. He is working and considering changing jobs since something more challenging.   Review of Systems  All other systems reviewed and are negative.      Objective:   Physical Exam BP 140/80 mmHg  Pulse 68  Ht 5\' 10"  (1.778 m)  Wt 211 lb 6.4 oz (95.89 kg)  BMI 30.33 kg/m2  SpO2 98%  General Appearance:    Alert, cooperative, no distress, appears stated age  Head:    Normocephalic, without obvious abnormality, atraumatic  Eyes:    PERRL, conjunctiva/corneas clear, EOM's intact, fundi    benign  Ears:    Normal TM's and external ear canals  Nose:   Nares normal, mucosa normal, no drainage or sinus   tenderness  Throat:   Lips, mucosa, and tongue normal; teeth and gums normal  Neck:   Supple, no lymphadenopathy;  thyroid:  no   enlargement/tenderness/nodules; no carotid   bruit or JVD  Back:    Spine nontender, no curvature, ROM normal, no CVA     tenderness  Lungs:     Clear to auscultation bilaterally without wheezes, rales or     ronchi; respirations unlabored  Chest Wall:    No tenderness or  deformity   Heart:    Regular rate and rhythm, S1 and S2 normal, no murmur, rub   or gallop  Breast Exam:    No chest wall tenderness, masses or gynecomastia  Abdomen:     Soft, non-tender, nondistended, normoactive bowel sounds,    no masses, no hepatosplenomegaly        Extremities:   No clubbing, cyanosis or edema.Small round lesion noted at the base of the third left finger near the MCP joint.  Pulses:   2+ and symmetric all extremities  Skin:   Skin color, texture, turgor normal, no rashes,Skin tags noted on the neck and in the left axilla  Lymph nodes:   Cervical, supraclavicular, and axillary nodes normal  Neurologic:   CNII-XII intact, normal strength, sensation and gait; reflexes 2+ and symmetric throughout          Psych:   Normal mood, affect, hygiene and grooming.          Assessment & Plan:  Ganglion and cyst of synovium, tendon and bursa - Plan: Ambulatory referral to Orthopedic Surgery  Skin tag  Cerumen impaction, bilateral  Need for prophylactic vaccination and inoculation against influenza - Plan: Hepatitis C antibody, Flu Vaccine QUAD 36+ mos IM  Need for shingles vaccine - Plan: Varicella-zoster vaccine subcutaneous  Hyperlipidemia -  Plan: CBC with Differential/Platelet, Comprehensive metabolic panel, Lipid panel, atorvastatin (LIPITOR) 20 MG tablet, DISCONTINUED: atorvastatin (LIPITOR) 20 MG tablet  Sleep apnea  History of colonic polyps - Plan: CBC with Differential/Platelet, Comprehensive metabolic panel  Tinnitus, bilateral - Plan: CBC with Differential/Platelet, Comprehensive metabolic panel  Encounter for long-term (current) use of medications - Plan: CBC with Differential/Platelet, Comprehensive metabolic panel Plain that the skin tags were not dangerous and therefore really did not need to be removed. Since he is having difficulty with his hand, I will refer to orthopedics as this does appear to be a small ganglion. He will continue with his CPAP.  Blood work will be drawn. His immunizations were updated. Nothing new to help with his tinnitus.

## 2015-04-30 ENCOUNTER — Telehealth: Payer: Self-pay

## 2015-04-30 LAB — HEPATITIS C ANTIBODY: HCV Ab: NEGATIVE

## 2015-04-30 NOTE — Telephone Encounter (Signed)
Left message informing patient that he is schedule to see Dr Erlinda Hong at [facility] 05/12/15 at 845am.

## 2015-05-18 ENCOUNTER — Other Ambulatory Visit (HOSPITAL_BASED_OUTPATIENT_CLINIC_OR_DEPARTMENT_OTHER): Payer: Self-pay | Admitting: Orthopaedic Surgery

## 2015-05-21 ENCOUNTER — Encounter (HOSPITAL_BASED_OUTPATIENT_CLINIC_OR_DEPARTMENT_OTHER): Payer: Self-pay | Admitting: *Deleted

## 2015-05-27 ENCOUNTER — Ambulatory Visit (HOSPITAL_BASED_OUTPATIENT_CLINIC_OR_DEPARTMENT_OTHER): Payer: BC Managed Care – PPO | Admitting: Anesthesiology

## 2015-05-27 ENCOUNTER — Encounter (HOSPITAL_BASED_OUTPATIENT_CLINIC_OR_DEPARTMENT_OTHER)
Admission: RE | Disposition: A | Discharge status: Home / Self Care | Payer: Self-pay | Source: Ambulatory Visit | Attending: Orthopaedic Surgery

## 2015-05-27 ENCOUNTER — Encounter (HOSPITAL_BASED_OUTPATIENT_CLINIC_OR_DEPARTMENT_OTHER): Payer: Self-pay

## 2015-05-27 ENCOUNTER — Ambulatory Visit (HOSPITAL_BASED_OUTPATIENT_CLINIC_OR_DEPARTMENT_OTHER)
Admission: RE | Admit: 2015-05-27 | Discharge: 2015-05-27 | Disposition: A | Discharge status: Home / Self Care | Payer: BC Managed Care – PPO | Source: Ambulatory Visit | Attending: Orthopaedic Surgery | Admitting: Orthopaedic Surgery

## 2015-05-27 DIAGNOSIS — G4730 Sleep apnea, unspecified: Secondary | ICD-10-CM | POA: Insufficient documentation

## 2015-05-27 DIAGNOSIS — E785 Hyperlipidemia, unspecified: Secondary | ICD-10-CM | POA: Diagnosis not present

## 2015-05-27 DIAGNOSIS — M67442 Ganglion, left hand: Secondary | ICD-10-CM | POA: Diagnosis present

## 2015-05-27 HISTORY — PX: GANGLION CYST EXCISION: SHX1691

## 2015-05-27 LAB — GLUCOSE, CAPILLARY: Glucose-Capillary: 136 mg/dL — ABNORMAL HIGH (ref 65–99)

## 2015-05-27 SURGERY — EXCISION, GANGLION CYST, WRIST
Anesthesia: Regional | Site: Finger | Laterality: Left

## 2015-05-27 MED ORDER — LIDOCAINE HCL (CARDIAC) 20 MG/ML IV SOLN
INTRAVENOUS | Status: AC
  Filled 2015-05-27: qty 5

## 2015-05-27 MED ORDER — MIDAZOLAM HCL 5 MG/5ML IJ SOLN
INTRAMUSCULAR | Status: DC | PRN
  Administered 2015-05-27: 2 mg via INTRAVENOUS

## 2015-05-27 MED ORDER — BUPIVACAINE HCL (PF) 0.5 % IJ SOLN
INTRAMUSCULAR | Status: DC | PRN
  Administered 2015-05-27: 7 mL

## 2015-05-27 MED ORDER — LACTATED RINGERS IV SOLN
INTRAVENOUS | Status: DC
  Administered 2015-05-27: 10:00:00 via INTRAVENOUS

## 2015-05-27 MED ORDER — HYDROCODONE-ACETAMINOPHEN 5-325 MG PO TABS: 1.0000 | ORAL_TABLET | Freq: Four times a day (QID) | ORAL | Status: DC | PRN

## 2015-05-27 MED ORDER — SCOPOLAMINE 1 MG/3DAYS TD PT72: 1.0000 | MEDICATED_PATCH | Freq: Once | TRANSDERMAL | Status: DC | PRN

## 2015-05-27 MED ORDER — MIDAZOLAM HCL 2 MG/2ML IJ SOLN: 1.0000 mg | INTRAMUSCULAR | Status: DC | PRN

## 2015-05-27 MED ORDER — BACITRACIN ZINC 500 UNIT/GM EX OINT
TOPICAL_OINTMENT | CUTANEOUS | Status: AC
  Filled 2015-05-27: qty 28.35

## 2015-05-27 MED ORDER — BUPIVACAINE HCL (PF) 0.25 % IJ SOLN
INTRAMUSCULAR | Status: AC
  Filled 2015-05-27: qty 60

## 2015-05-27 MED ORDER — GLYCOPYRROLATE 0.2 MG/ML IJ SOLN: 0.2000 mg | Freq: Once | INTRAMUSCULAR | Status: DC | PRN

## 2015-05-27 MED ORDER — SENNOSIDES-DOCUSATE SODIUM 8.6-50 MG PO TABS: 1.0000 | ORAL_TABLET | Freq: Every evening | ORAL | Status: DC | PRN

## 2015-05-27 MED ORDER — CEFAZOLIN SODIUM-DEXTROSE 2-3 GM-% IV SOLR
2.0000 g | INTRAVENOUS | Status: AC
  Administered 2015-05-27: 2 g via INTRAVENOUS

## 2015-05-27 MED ORDER — FENTANYL CITRATE (PF) 100 MCG/2ML IJ SOLN
INTRAMUSCULAR | Status: AC
  Filled 2015-05-27: qty 2

## 2015-05-27 MED ORDER — MIDAZOLAM HCL 2 MG/2ML IJ SOLN
INTRAMUSCULAR | Status: AC
  Filled 2015-05-27: qty 2

## 2015-05-27 MED ORDER — BUPIVACAINE HCL (PF) 0.5 % IJ SOLN
INTRAMUSCULAR | Status: AC
  Filled 2015-05-27: qty 30

## 2015-05-27 MED ORDER — ONDANSETRON HCL 4 MG/2ML IJ SOLN: 4.0000 mg | Freq: Once | INTRAMUSCULAR | Status: DC | PRN

## 2015-05-27 MED ORDER — LIDOCAINE HCL (PF) 1 % IJ SOLN
INTRAMUSCULAR | Status: AC
  Filled 2015-05-27: qty 30

## 2015-05-27 MED ORDER — FENTANYL CITRATE (PF) 100 MCG/2ML IJ SOLN
50.0000 ug | INTRAMUSCULAR | Status: DC | PRN
  Administered 2015-05-27 (×2): 50 ug via INTRAVENOUS

## 2015-05-27 MED ORDER — PROPOFOL 10 MG/ML IV BOLUS
INTRAVENOUS | Status: AC
  Filled 2015-05-27: qty 20

## 2015-05-27 MED ORDER — MEPERIDINE HCL 25 MG/ML IJ SOLN: 6.2500 mg | INTRAMUSCULAR | Status: DC | PRN

## 2015-05-27 MED ORDER — BUPIVACAINE HCL (PF) 0.5 % IJ SOLN
INTRAMUSCULAR | Status: AC
  Filled 2015-05-27: qty 60

## 2015-05-27 MED ORDER — ONDANSETRON HCL 4 MG/2ML IJ SOLN
INTRAMUSCULAR | Status: DC | PRN
  Administered 2015-05-27: 4 mg via INTRAVENOUS

## 2015-05-27 MED ORDER — LIDOCAINE HCL (PF) 0.5 % IJ SOLN
INTRAMUSCULAR | Status: DC | PRN
  Administered 2015-05-27: 175 mg via INTRAVENOUS

## 2015-05-27 MED ORDER — LIDOCAINE HCL (PF) 0.5 % IJ SOLN
INTRAMUSCULAR | Status: AC
  Filled 2015-05-27: qty 50

## 2015-05-27 MED ORDER — HYDROMORPHONE HCL 1 MG/ML IJ SOLN: 0.2500 mg | INTRAMUSCULAR | Status: DC | PRN

## 2015-05-27 MED ORDER — PROPOFOL 500 MG/50ML IV EMUL
INTRAVENOUS | Status: DC | PRN
  Administered 2015-05-27: 200 ug/kg/min via INTRAVENOUS

## 2015-05-27 SURGICAL SUPPLY — 55 items
ADH SKN CLS APL DERMABOND .7 (GAUZE/BANDAGES/DRESSINGS)
BLADE HEX COATED 2.75 (ELECTRODE) ×1 IMPLANT
BLADE SURG 15 STRL LF DISP TIS (BLADE) ×2 IMPLANT
BLADE SURG 15 STRL SS (BLADE) ×4
BNDG CMPR 9X4 STRL LF SNTH (GAUZE/BANDAGES/DRESSINGS)
BNDG COHESIVE 4X5 TAN STRL (GAUZE/BANDAGES/DRESSINGS) IMPLANT
BNDG ESMARK 4X9 LF (GAUZE/BANDAGES/DRESSINGS) ×1 IMPLANT
BRUSH SCRUB EZ PLAIN DRY (MISCELLANEOUS) ×2 IMPLANT
CANISTER SUCT 1200ML W/VALVE (MISCELLANEOUS) IMPLANT
CORDS BIPOLAR (ELECTRODE) ×1 IMPLANT
COVER BACK TABLE 60X90IN (DRAPES) ×2 IMPLANT
CUFF TOURNIQUET SINGLE 18IN (TOURNIQUET CUFF) ×2 IMPLANT
DECANTER SPIKE VIAL GLASS SM (MISCELLANEOUS) ×1 IMPLANT
DERMABOND ADVANCED (GAUZE/BANDAGES/DRESSINGS)
DERMABOND ADVANCED .7 DNX12 (GAUZE/BANDAGES/DRESSINGS) IMPLANT
DRAPE EXTREMITY T 121X128X90 (DRAPE) ×2 IMPLANT
DRAPE SURG 17X23 STRL (DRAPES) ×4 IMPLANT
DRAPE U 20/CS (DRAPES) ×2 IMPLANT
ELECT REM PT RETURN 9FT ADLT (ELECTROSURGICAL) ×2
ELECTRODE REM PT RTRN 9FT ADLT (ELECTROSURGICAL) ×1 IMPLANT
GAUZE SPONGE 4X4 12PLY STRL (GAUZE/BANDAGES/DRESSINGS) ×2 IMPLANT
GAUZE SPONGE 4X4 16PLY XRAY LF (GAUZE/BANDAGES/DRESSINGS) IMPLANT
GAUZE XEROFORM 1X8 LF (GAUZE/BANDAGES/DRESSINGS) ×2 IMPLANT
GLOVE BIOGEL M STRL SZ7.5 (GLOVE) ×1 IMPLANT
GLOVE BIOGEL PI IND STRL 9 (GLOVE) IMPLANT
GLOVE BIOGEL PI INDICATOR 9 (GLOVE) ×1
GLOVE SKINSENSE NS SZ7.5 (GLOVE) ×1
GLOVE SKINSENSE STRL SZ7.5 (GLOVE) ×1 IMPLANT
GLOVE SURG SYN 7.5  E (GLOVE) ×1
GLOVE SURG SYN 7.5 E (GLOVE) ×1 IMPLANT
GLOVE SURG SYN 7.5 PF PI (GLOVE) ×1 IMPLANT
GOWN STRL REIN XL XLG (GOWN DISPOSABLE) ×2 IMPLANT
GOWN STRL REUS W/ TWL LRG LVL3 (GOWN DISPOSABLE) ×1 IMPLANT
GOWN STRL REUS W/TWL LRG LVL3 (GOWN DISPOSABLE) ×2
NS IRRIG 1000ML POUR BTL (IV SOLUTION) IMPLANT
PACK BASIN DAY SURGERY FS (CUSTOM PROCEDURE TRAY) ×2 IMPLANT
PAD CAST 4YDX4 CTTN HI CHSV (CAST SUPPLIES) IMPLANT
PADDING CAST COTTON 4X4 STRL (CAST SUPPLIES)
PADDING CAST SYNTHETIC 4 (CAST SUPPLIES)
PADDING CAST SYNTHETIC 4X4 STR (CAST SUPPLIES) IMPLANT
PENCIL BUTTON HOLSTER BLD 10FT (ELECTRODE) ×1 IMPLANT
SPONGE LAP 18X18 X RAY DECT (DISPOSABLE) IMPLANT
STOCKINETTE 4X48 STRL (DRAPES) IMPLANT
SUCTION FRAZIER HANDLE 10FR (MISCELLANEOUS) ×1
SUCTION TUBE FRAZIER 10FR DISP (MISCELLANEOUS) ×1 IMPLANT
SUT ETHILON 2 0 FS 18 (SUTURE) IMPLANT
SUT ETHILON 4 0 PS 2 18 (SUTURE) ×1 IMPLANT
SUT VIC AB 0 CT1 27 (SUTURE) ×2
SUT VIC AB 0 CT1 27XBRD ANBCTR (SUTURE) ×1 IMPLANT
SUT VIC AB 2-0 CT1 27 (SUTURE)
SUT VIC AB 2-0 CT1 TAPERPNT 27 (SUTURE) IMPLANT
SYR BULB 3OZ (MISCELLANEOUS) IMPLANT
TOWEL OR 17X24 6PK STRL BLUE (TOWEL DISPOSABLE) ×2 IMPLANT
TOWEL OR NON WOVEN STRL DISP B (DISPOSABLE) ×2 IMPLANT
YANKAUER SUCT BULB TIP NO VENT (SUCTIONS) IMPLANT

## 2015-05-27 NOTE — Anesthesia Postprocedure Evaluation (Signed)
Anesthesia Post Note  Patient: Antonio Harvey  Procedure(s) Performed: Procedure(s) (LRB): REMOVAL GANGLION CYST OF LEFT 3RD FINGER (Left)  Patient location during evaluation: PACU Anesthesia Type: Bier Block Level of consciousness: awake Pain management: pain level controlled Vital Signs Assessment: post-procedure vital signs reviewed and stable Respiratory status: spontaneous breathing and respiratory function stable Cardiovascular status: stable Postop Assessment: no signs of nausea or vomiting Anesthetic complications: no    Last Vitals:  Filed Vitals:   05/27/15 1238 05/27/15 1300  BP:  140/85  Pulse: 55 60  Temp:  36.6 C  Resp: 14 16    Last Pain:  Filed Vitals:   05/27/15 1302  PainSc: 0-No pain                 Chayse Gracey

## 2015-05-27 NOTE — Op Note (Signed)
   Date of Surgery: 05/27/2015  INDICATIONS: Antonio Harvey is a 60 y.o.-year-old male with a left volar retinacular cyst of A2 pulley of middle finger;  The patient did consent to the procedure after discussion of the risks and benefits.  PREOPERATIVE DIAGNOSIS: Left middle finger volar retinacular cyst, A2 pulley  POSTOPERATIVE DIAGNOSIS: Same.  PROCEDURE: Excision of left middle finger ganglion cyst  SURGEON: N. Eduard Roux, M.D.  ASSIST: none.  ANESTHESIA:  Bier block  IV FLUIDS AND URINE: See anesthesia.  ESTIMATED BLOOD LOSS: minimal mL.  IMPLANTS: none  DRAINS: none  COMPLICATIONS: None.  DESCRIPTION OF PROCEDURE: The patient was brought to the operating room and placed supine on the operating table.  The patient had been signed prior to the procedure and this was documented. The patient had the anesthesia placed by the anesthesiologist.  A time-out was performed to confirm that this was the correct patient, site, side and location. The patient did receive antibiotics prior to the incision and was re-dosed during the procedure as needed at indicated intervals.  A tourniquet was placed.  The patient had the operative extremity prepped and draped in the standard surgical fashion.    A standard zigzag Brunner's incision was used. Full-thickness flaps were created and elevated off of the flexor tendon sheath. The retinacular cyst was identified as and excised as a whole. There is no involvement of the flexor tendon. There is no disease of the flexor tendon. The wound was then thoroughly irrigated. Local anesthesia was infiltrated. The incision was closed with interrupted 4-0 nylon. Sterile dressings were applied. Patient tolerated the procedure well and had no immediate complications.  POSTOPERATIVE PLAN: Patient will be weight bear as tolerated to the left hand. No heavy lifting for 4 weeks.  Azucena Cecil, MD [name] 11:49 AM

## 2015-05-27 NOTE — Transfer of Care (Signed)
Immediate Anesthesia Transfer of Care Note  Patient: Antonio Harvey  Procedure(s) Performed: Procedure(s): REMOVAL GANGLION CYST OF LEFT 3RD FINGER (Left)  Patient Location: PACU  Anesthesia Type:MAC  Level of Consciousness: awake, alert , oriented and patient cooperative  Airway & Oxygen Therapy: Patient Spontanous Breathing and Patient connected to nasal cannula oxygen  Post-op Assessment: Report given to RN and Post -op Vital signs reviewed and stable  Post vital signs: Reviewed and stable  Last Vitals:  Filed Vitals:   05/27/15 1010  BP: 156/80  Pulse: 66  Temp: 36.7 C  Resp: 18    Complications: No apparent anesthesia complications

## 2015-05-27 NOTE — Discharge Instructions (Signed)
Postoperative instructions: ° °Weightbearing: as tolerated, no heavy lifting ° °Dressing instructions: Keep your dressing and/or splint clean and dry at all times.  It will be removed at your first post-operative appointment.  Your stitches and/or staples will be removed at this visit. ° °Incision instructions:  Do not soak your incision for 3 weeks after surgery.  If the incision gets wet, pat dry and do not scrub the incision. ° °Pain control:  You have been given a prescription to be taken as directed for post-operative pain control.  In addition, elevate the operative extremity above the heart at all times to prevent swelling and throbbing pain. ° °Take over-the-counter Colace, 100mg by mouth twice a day while taking narcotic pain medications to help prevent constipation. ° °Follow up appointments: °1) 10-14 days for suture removal and wound check. °2) Dr. Xu as scheduled. ° ° ------------------------------------------------------------------------------------------------------------- ° °After Surgery Pain Control: ° °After your surgery, post-surgical discomfort or pain is likely. This discomfort can last several days to a few weeks. At certain times of the day your discomfort may be more intense.  °Did you receive a nerve block?  °A nerve block can provide pain relief for one hour to two days after your surgery. As long as the nerve block is working, you will experience little or no sensation in the area the surgeon operated on.  °As the nerve block wears off, you will begin to experience pain or discomfort. It is very important that you begin taking your prescribed pain medication before the nerve block fully wears off. Treating your pain at the first sign of the block wearing off will ensure your pain is better controlled and more tolerable when full-sensation returns. Do not wait until the pain is intolerable, as the medicine will be less effective. It is better to treat pain in advance than to try and catch  up.  °General Anesthesia:  °If you did not receive a nerve block during your surgery, you will need to start taking your pain medication shortly after your surgery and should continue to do so as prescribed by your surgeon.  °Pain Medication:  °Most commonly we prescribe Vicodin and Percocet for post-operative pain. Both of these medications contain a combination of acetaminophen (Tylenol®) and a narcotic to help control pain.  °· It takes between 30 and 45 minutes before pain medication starts to work. It is important to take your medication before your pain level gets too intense.  °· Nausea is a common side effect of many pain medications. You will want to eat something before taking your pain medicine to help prevent nausea.  °· If you are taking a prescription pain medication that contains acetaminophen, we recommend that you do not take additional over the counter acetaminophen (Tylenol®).  °Other pain relieving options:  °· Using a cold pack to ice the affected area a few times a day (15 to 20 minutes at a time) can help to relieve pain, reduce swelling and bruising.  °· Elevation of the affected area can also help to reduce pain and swelling. ° ° ° ° ° °Post Anesthesia Home Care Instructions ° °Activity: °Get plenty of rest for the remainder of the day. A responsible adult should stay with you for 24 hours following the procedure.  °For the next 24 hours, DO NOT: °-Drive a car °-Operate machinery °-Drink alcoholic beverages °-Take any medication unless instructed by your physician °-Make any legal decisions or sign important papers. ° °Meals: °Start with liquid foods such   as gelatin or soup. Progress to regular foods as tolerated. Avoid greasy, spicy, heavy foods. If nausea and/or vomiting occur, drink only clear liquids until the nausea and/or vomiting subsides. Call your physician if vomiting continues. ° °Special Instructions/Symptoms: °Your throat may feel dry or sore from the anesthesia or the breathing  tube placed in your throat during surgery. If this causes discomfort, gargle with warm salt water. The discomfort should disappear within 24 hours. ° °If you had a scopolamine patch placed behind your ear for the management of post- operative nausea and/or vomiting: ° °1. The medication in the patch is effective for 72 hours, after which it should be removed.  Wrap patch in a tissue and discard in the trash. Wash hands thoroughly with soap and water. °2. You may remove the patch earlier than 72 hours if you experience unpleasant side effects which may include dry mouth, dizziness or visual disturbances. °3. Avoid touching the patch. Wash your hands with soap and water after contact with the patch. °  ° °

## 2015-05-27 NOTE — H&P (Signed)
    PREOPERATIVE H&P  Chief Complaint: left 3rd finger ganglion cyst  HPI: Antonio Harvey is a 60 y.o. male who presents for surgical treatment of left 3rd finger ganglion cyst.  He denies any changes in medical history.  Past Medical History  Diagnosis Date  . Allergy   . Hyperlipidemia   . History of colonic polyps   . Sleep apnea   . Chronic LBP   . Diverticulosis    Past Surgical History  Procedure Laterality Date  . Tonsillectomy and adenoidectomy  1966  . Joint replacement  1994    RT ARTHROSCOPIC KNEE SURGERY  . Incision and drainage perirectal abscess N/A 06/28/2013    Procedure: IRRIGATION AND DEBRIDEMENT PERIRECTAL ABSCESS;  Surgeon: Harl Bowie, MD;  Location: [facility];  Service: General;  Laterality: N/A;   Social History   Social History  . Marital Status: Married    Spouse Name: N/A  . Number of Children: N/A  . Years of Education: N/A   Social History Main Topics  . Smoking status: Never Smoker   . Smokeless tobacco: Never Used  . Alcohol Use: No  . Drug Use: No  . Sexual Activity: Not Asked   Other Topics Concern  . None   Social History Narrative   Family History  Problem Relation Age of Onset  . Asthma Mother   . Hypertension Mother   . Stroke Father     died of CVA  . Kidney disease Father     dialysis  . Diabetes Father   . Arthritis Sister   . Colon cancer Neg Hx    No Known Allergies Prior to Admission medications   Medication Sig Start Date End Date Taking? Authorizing Provider  atorvastatin (LIPITOR) 20 MG tablet Take 1 tablet (20 mg total) by mouth daily. 04/29/15   Denita Lung, MD     Positive ROS: All other systems have been reviewed and were otherwise negative with the exception of those mentioned in the HPI and as above.  Physical Exam: General: Alert, no acute distress Cardiovascular: No pedal edema Respiratory: No cyanosis, no use of accessory musculature GI: abdomen soft Skin: No lesions in the area of chief  complaint Neurologic: Sensation intact distally Psychiatric: Patient is competent for consent with normal mood and affect Lymphatic: no lymphedema  MUSCULOSKELETAL: exam stable  Assessment: left 3rd finger ganglion cyst  Plan: Plan for Procedure(s): REMOVAL GANGLION CYST OF LEFT 3RD FINGER  The risks benefits and alternatives were discussed with the patient including but not limited to the risks of nonoperative treatment, versus surgical intervention including infection, bleeding, nerve injury,  blood clots, cardiopulmonary complications, morbidity, mortality, among others, and they were willing to proceed.   Marianna Payment, MD   05/27/2015 7:06 AM

## 2015-05-27 NOTE — Anesthesia Procedure Notes (Signed)
Procedure Name: MAC Date/Time: 05/27/2015 11:14 AM Performed by: Wanita Chamberlain Pre-anesthesia Checklist: Patient identified, Timeout performed, Emergency Drugs available, Suction available and Patient being monitored Patient Re-evaluated:Patient Re-evaluated prior to inductionOxygen Delivery Method: Nasal cannula Intubation Type: IV induction Placement Confirmation: positive ETCO2 and breath sounds checked- equal and bilateral Dental Injury: Teeth and Oropharynx as per pre-operative assessment

## 2015-05-27 NOTE — Anesthesia Preprocedure Evaluation (Signed)
Anesthesia Evaluation  Patient identified by MRN, date of birth, ID band Patient awake    Reviewed: Allergy & Precautions, NPO status , Patient's Chart, lab work & pertinent test results  Airway Mallampati: I  TM Distance: >3 FB Neck ROM: Full    Dental   Pulmonary    Pulmonary exam normal        Cardiovascular Normal cardiovascular exam     Neuro/Psych    GI/Hepatic   Endo/Other    Renal/GU      Musculoskeletal   Abdominal   Peds  Hematology   Anesthesia Other Findings   Reproductive/Obstetrics                             Anesthesia Physical Anesthesia Plan  ASA: II  Anesthesia Plan: Bier Block   Post-op Pain Management:    Induction: Intravenous  Airway Management Planned: Simple Face Mask  Additional Equipment:   Intra-op Plan:   Post-operative Plan:   Informed Consent: I have reviewed the patients History and Physical, chart, labs and discussed the procedure including the risks, benefits and alternatives for the proposed anesthesia with the patient or authorized representative who has indicated his/her understanding and acceptance.     Plan Discussed with: CRNA and Surgeon  Anesthesia Plan Comments:         Anesthesia Quick Evaluation

## 2015-05-28 ENCOUNTER — Encounter (HOSPITAL_BASED_OUTPATIENT_CLINIC_OR_DEPARTMENT_OTHER): Payer: Self-pay | Admitting: Orthopaedic Surgery

## 2016-05-09 ENCOUNTER — Ambulatory Visit (INDEPENDENT_AMBULATORY_CARE_PROVIDER_SITE_OTHER): Payer: BC Managed Care – PPO | Admitting: Family Medicine

## 2016-05-09 ENCOUNTER — Encounter: Payer: Self-pay | Admitting: Family Medicine

## 2016-05-09 VITALS — BP 150/80 | HR 75 | Wt 209.8 lb

## 2016-05-09 DIAGNOSIS — Z79899 Other long term (current) drug therapy: Secondary | ICD-10-CM | POA: Diagnosis not present

## 2016-05-09 DIAGNOSIS — J309 Allergic rhinitis, unspecified: Secondary | ICD-10-CM

## 2016-05-09 DIAGNOSIS — G4730 Sleep apnea, unspecified: Secondary | ICD-10-CM | POA: Diagnosis not present

## 2016-05-09 DIAGNOSIS — E785 Hyperlipidemia, unspecified: Secondary | ICD-10-CM

## 2016-05-09 DIAGNOSIS — H9313 Tinnitus, bilateral: Secondary | ICD-10-CM

## 2016-05-09 DIAGNOSIS — I10 Essential (primary) hypertension: Secondary | ICD-10-CM | POA: Diagnosis not present

## 2016-05-09 LAB — LIPID PANEL
CHOL/HDL RATIO: 4.9 ratio (ref <5.0)
Cholesterol: 171 mg/dL (ref <200)
HDL: 35 mg/dL — AB (ref >40)
LDL CALC: 95 mg/dL (ref <100)
Triglycerides: 207 mg/dL — ABNORMAL HIGH (ref <150)
VLDL: 41 mg/dL — ABNORMAL HIGH (ref <30)

## 2016-05-09 LAB — CBC WITH DIFFERENTIAL/PLATELET
BASOS PCT: 0 %
Basophils Absolute: 0 cells/uL (ref 0–200)
Eosinophils Absolute: 64 cells/uL (ref 15–500)
Eosinophils Relative: 1 %
HCT: 43 % (ref 38.5–50.0)
Hemoglobin: 13.7 g/dL (ref 13.2–17.1)
LYMPHS PCT: 37 %
Lymphs Abs: 2368 cells/uL (ref 850–3900)
MCH: 23.5 pg — ABNORMAL LOW (ref 27.0–33.0)
MCHC: 31.9 g/dL — AB (ref 32.0–36.0)
MCV: 73.8 fL — AB (ref 80.0–100.0)
MONOS PCT: 7 %
MPV: 9.7 fL (ref 7.5–12.5)
Monocytes Absolute: 448 cells/uL (ref 200–950)
NEUTROS PCT: 55 %
Neutro Abs: 3520 cells/uL (ref 1500–7800)
PLATELETS: 168 10*3/uL (ref 140–400)
RBC: 5.83 MIL/uL — AB (ref 4.20–5.80)
RDW: 17.4 % — AB (ref 11.0–15.0)
WBC: 6.4 10*3/uL (ref 4.0–10.5)

## 2016-05-09 LAB — COMPREHENSIVE METABOLIC PANEL
ALT: 25 U/L (ref 9–46)
AST: 21 U/L (ref 10–35)
Albumin: 4.4 g/dL (ref 3.6–5.1)
Alkaline Phosphatase: 90 U/L (ref 40–115)
BUN: 16 mg/dL (ref 7–25)
CALCIUM: 9.6 mg/dL (ref 8.6–10.3)
CO2: 23 mmol/L (ref 20–31)
Chloride: 106 mmol/L (ref 98–110)
Creat: 1.43 mg/dL — ABNORMAL HIGH (ref 0.70–1.25)
GLUCOSE: 112 mg/dL — AB (ref 65–99)
POTASSIUM: 4 mmol/L (ref 3.5–5.3)
Sodium: 140 mmol/L (ref 135–146)
Total Bilirubin: 0.3 mg/dL (ref 0.2–1.2)
Total Protein: 7.1 g/dL (ref 6.1–8.1)

## 2016-05-09 MED ORDER — ATORVASTATIN CALCIUM 20 MG PO TABS: 20.0000 mg | ORAL_TABLET | Freq: Every day | ORAL | 3 refills | Status: DC

## 2016-05-09 MED ORDER — AMLODIPINE BESYLATE 5 MG PO TABS: 5.0000 mg | ORAL_TABLET | Freq: Every day | ORAL | 3 refills | Status: DC

## 2016-05-09 NOTE — Patient Instructions (Signed)
150 minutes a week of something physical  

## 2016-05-09 NOTE — Progress Notes (Signed)
   Subjective:    Patient ID: Antonio Harvey, male    DOB: June 17, 1955, 61 y.o.   MRN: [id number]  HPI he is here for medication management visit. He does have underlying OSA and is being cared for at the [facility] for this. He also continues have difficulty with tinnitus. He continues on Lipitor and is having no difficulty with that. He does have underlying allergies but they're not causing any difficulty.  Review of Systems     Objective:   Physical Exam Alert and in no distress. Tympanic membranes and canals are normal. Pharyngeal area is normal. Neck is supple without adenopathy or thyromegaly. Cardiac exam shows a regular sinus rhythm without murmurs or gallops. Lungs are clear to auscultation.        Assessment & Plan:  Hyperlipidemia, unspecified hyperlipidemia type - Plan: Lipid panel, CANCELED: Lipid panel  Tinnitus, bilateral  Tinnitus of both ears  Sleep apnea, unspecified type  Encounter for long-term (current) use of medications - Plan: CBC with Differential, Comprehensive metabolic panel, CANCELED: CBC with Differential/Platelet, CANCELED: Comprehensive metabolic panel, CANCELED: Lipid panel  Essential hypertension - Plan: amLODipine (NORVASC) 5 MG tablet, Comprehensive metabolic panel, CANCELED: CBC with Differential/Platelet, CANCELED: Comprehensive metabolic panel General he is doing quite well. He will continue on his present medications. I will add amlodipine to his regimen since he really does have hypertension especially with the new lower criteria. He will continue to use his CPAP.

## 2016-06-08 ENCOUNTER — Encounter: Payer: Self-pay | Admitting: Family Medicine

## 2016-06-08 ENCOUNTER — Ambulatory Visit (INDEPENDENT_AMBULATORY_CARE_PROVIDER_SITE_OTHER): Payer: BC Managed Care – PPO | Admitting: Family Medicine

## 2016-06-08 VITALS — BP 130/68 | HR 77 | Wt 209.0 lb

## 2016-06-08 DIAGNOSIS — I10 Essential (primary) hypertension: Secondary | ICD-10-CM

## 2016-06-08 NOTE — Progress Notes (Signed)
   Subjective:    Patient ID: Antonio Harvey, male    DOB: [birth year], 61 y.o.   MRN: [id number]  HPI He is here for recheck on his blood pressure. He is now taking amlodipine and having no symptoms from that.   Review of Systems     Objective:   Physical Exam Alert and in no distress. Blood pressure is recorded.       Assessment & Plan:  Essential hypertension He will maintain the same medication regimen and return here in one year.

## 2016-10-06 ENCOUNTER — Telehealth: Payer: Self-pay

## 2016-10-06 NOTE — Telephone Encounter (Signed)
Requested records faxed electronically through [facility] to [place] per pt request.

## 2016-10-14 ENCOUNTER — Telehealth (INDEPENDENT_AMBULATORY_CARE_PROVIDER_SITE_OTHER): Payer: Self-pay | Admitting: Orthopaedic Surgery

## 2016-10-14 NOTE — Telephone Encounter (Signed)
SRS RECORDS RELATING TO "KNEE" [place] [facility] [place]  [address] [place]  [address]

## 2016-12-01 ENCOUNTER — Ambulatory Visit (INDEPENDENT_AMBULATORY_CARE_PROVIDER_SITE_OTHER): Payer: BC Managed Care – PPO

## 2016-12-01 ENCOUNTER — Encounter (INDEPENDENT_AMBULATORY_CARE_PROVIDER_SITE_OTHER): Payer: Self-pay | Admitting: Orthopedic Surgery

## 2016-12-01 ENCOUNTER — Ambulatory Visit (INDEPENDENT_AMBULATORY_CARE_PROVIDER_SITE_OTHER): Payer: BC Managed Care – PPO | Admitting: Orthopedic Surgery

## 2016-12-01 DIAGNOSIS — G8929 Other chronic pain: Secondary | ICD-10-CM

## 2016-12-01 DIAGNOSIS — M25561 Pain in right knee: Secondary | ICD-10-CM

## 2016-12-01 MED ORDER — METHYLPREDNISOLONE ACETATE 40 MG/ML IJ SUSP
40.0000 mg | INTRAMUSCULAR | Status: AC | PRN
  Administered 2016-12-01: 40 mg via INTRA_ARTICULAR

## 2016-12-01 MED ORDER — LIDOCAINE HCL 1 % IJ SOLN
5.0000 mL | INTRAMUSCULAR | Status: AC | PRN
  Administered 2016-12-01: 5 mL

## 2016-12-01 NOTE — Progress Notes (Signed)
Office Visit Note   Patient: Antonio Harvey           Date of Birth: 10/20/1955           MRN: [id number] Visit Date: 12/01/2016              Requested by: Antonio Lung, MD [address] [place], [place] [address] PCP: Antonio Lung, MD  Chief Complaint  Patient presents with  . Right Knee - Pain      HPI: Patient is a 61 year old gentleman with osteoarthritis of the right knee. The patient states he recently he has slipped a few times and has had increased swelling in the right knee after slipping. Patient complains of pain with weightbearing. Patient states he used to be active with sports. Patient denies any mechanical symptoms.  Assessment & Plan: Visit Diagnoses:  1. Chronic pain of right knee     Plan: Right knee was injected. Since he has had temporary relief from previous steroid injections patient may benefit from a hyaluronic acid injection he will call once this injection wears off and we will see if he could benefit from a hyaluronic acid injection.  Follow-Up Instructions: Return if symptoms worsen or fail to improve.   Ortho Exam  Patient is alert, oriented, no adenopathy, well-dressed, normal affect, normal respiratory effort. Examination patient has an antalgic gait. He has a mild effusion of the right knee he is primarily tender palpation of medial lateral joint line or no mechanical symptoms with range of motion patient's collateral and cruciate ligaments are stable.  Imaging: Xr Knee 3 View Right  Result Date: 12/01/2016 Three-view radiographs of the right knee shows a congruent patellofemoral joint. There is calcification of the meniscus consistent with CPPD. There is joint space narrowing and subcondylar sclerosis.  No images are attached to the encounter.  Labs: Lab Results  Component Value Date   REPTSTATUS 07/01/2013 FINAL 06/28/2013   GRAMSTAIN  06/28/2013    NO WBC SEEN NO SQUAMOUS EPITHELIAL CELLS SEEN NO ORGANISMS SEEN Performed  at [facility]  06/28/2013    FEW ESCHERICHIA COLI Performed at [facility] 06/28/2013    Orders:  Orders Placed This Encounter  Procedures  . XR KNEE 3 VIEW RIGHT   No orders of the defined types were placed in this encounter.    Procedures: Large Joint Inj Date/Time: 12/01/2016 2:12 PM Performed by: Antonio Harvey Authorized by: Antonio Harvey   Consent Given by:  Patient Site marked: the procedure site was marked   Timeout: prior to procedure the correct patient, procedure, and site was verified   Indications:  Pain and diagnostic evaluation Location:  Knee Site:  R knee Prep: patient was prepped and draped in usual sterile fashion   Needle Size:  22 G Needle Length:  1.5 inches Approach:  Anteromedial Ultrasound Guidance: No   Fluoroscopic Guidance: No   Arthrogram: No   Medications:  5 mL lidocaine 1 %; 40 mg methylPREDNISolone acetate 40 MG/ML Aspiration Attempted: No   Patient tolerance:  Patient tolerated the procedure well with no immediate complications    Clinical Data: No additional findings.  ROS:  All other systems negative, except as noted in the HPI. Review of Systems  Objective: Vital Signs: There were no vitals taken for this visit.  Specialty Comments:  No specialty comments available.  PMFS History: Patient Active Problem List   Diagnosis Date Noted  .  Allergic rhinitis 05/09/2016  . History of colonic polyps 07/24/2014  . Diverticulosis of colon without hemorrhage 07/24/2014  . Tinnitus 04/26/2013  . Hyperlipidemia 01/25/2011  . Sleep apnea 01/25/2011   Past Medical History:  Diagnosis Date  . Allergy   . Chronic LBP   . Diverticulosis   . History of colonic polyps   . Hyperlipidemia   . Sleep apnea     Family History  Problem Relation Age of Onset  . Asthma Mother   . Hypertension Mother   . Stroke Father        died of CVA  . Kidney disease Father        dialysis    . Diabetes Father   . Arthritis Sister   . Colon cancer Neg Hx     Past Surgical History:  Procedure Laterality Date  . GANGLION CYST EXCISION Left 05/27/2015   Procedure: REMOVAL GANGLION CYST OF LEFT 3RD FINGER;  Surgeon: Antonio Koyanagi, MD;  Location: [facility];  Service: Orthopedics;  Laterality: Left;  . INCISION AND DRAINAGE PERIRECTAL ABSCESS N/A 06/28/2013   Procedure: IRRIGATION AND DEBRIDEMENT PERIRECTAL ABSCESS;  Surgeon: Antonio Bowie, MD;  Location: [facility];  Service: General;  Laterality: N/A;  . TONSILLECTOMY AND ADENOIDECTOMY  1966   Social History   Occupational History  . Not on file.   Social History Main Topics  . Smoking status: Never Smoker  . Smokeless tobacco: Never Used  . Alcohol use No  . Drug use: No  . Sexual activity: Yes

## 2017-05-21 ENCOUNTER — Other Ambulatory Visit: Payer: Self-pay | Admitting: Family Medicine

## 2017-05-21 DIAGNOSIS — E785 Hyperlipidemia, unspecified: Secondary | ICD-10-CM

## 2017-05-22 ENCOUNTER — Telehealth: Payer: Self-pay

## 2017-05-22 ENCOUNTER — Telehealth: Payer: Self-pay | Admitting: Family Medicine

## 2017-05-22 NOTE — Telephone Encounter (Signed)
Pt called and made a medcheck app for Feb the 14 if we could send a 30 day supply for atorvastatin

## 2017-05-22 NOTE — Telephone Encounter (Signed)
Called pt to inform him that he needs to contact the office to schedule a med mgt appt. No answer left a voice mail

## 2017-05-24 ENCOUNTER — Other Ambulatory Visit: Payer: Self-pay

## 2017-05-24 DIAGNOSIS — I10 Essential (primary) hypertension: Secondary | ICD-10-CM

## 2017-05-24 MED ORDER — AMLODIPINE BESYLATE 5 MG PO TABS: 5.0000 mg | ORAL_TABLET | Freq: Every day | ORAL | 0 refills | Status: DC

## 2017-05-24 NOTE — Telephone Encounter (Signed)
Amlodipine and atorvastatin was sent in to Pharmacy. Pt appt is feb 14 . Thanks [employer]

## 2017-06-01 ENCOUNTER — Encounter: Payer: Self-pay | Admitting: Family Medicine

## 2017-06-01 ENCOUNTER — Ambulatory Visit: Payer: BC Managed Care – PPO | Admitting: Family Medicine

## 2017-06-01 VITALS — BP 130/82 | HR 66 | Wt 207.4 lb

## 2017-06-01 DIAGNOSIS — H9313 Tinnitus, bilateral: Secondary | ICD-10-CM

## 2017-06-01 DIAGNOSIS — J309 Allergic rhinitis, unspecified: Secondary | ICD-10-CM

## 2017-06-01 DIAGNOSIS — G4730 Sleep apnea, unspecified: Secondary | ICD-10-CM

## 2017-06-01 DIAGNOSIS — I10 Essential (primary) hypertension: Secondary | ICD-10-CM | POA: Diagnosis not present

## 2017-06-01 DIAGNOSIS — E785 Hyperlipidemia, unspecified: Secondary | ICD-10-CM | POA: Diagnosis not present

## 2017-06-01 MED ORDER — AMLODIPINE BESYLATE 5 MG PO TABS: 5.0000 mg | ORAL_TABLET | Freq: Every day | ORAL | 3 refills | Status: DC

## 2017-06-01 MED ORDER — ATORVASTATIN CALCIUM 20 MG PO TABS: 20.0000 mg | ORAL_TABLET | Freq: Every day | ORAL | 3 refills | Status: DC

## 2017-06-01 NOTE — Progress Notes (Signed)
   Subjective:    Patient ID: Antonio Harvey, male    DOB: 02-09-1956, 62 y.o.   MRN: [id number]  HPI He is here for an interval evaluation.  He continues to have difficulty with knee pain.  He has had his knee injected by Dr. due to on a couple of occasions.  He is now getting help through the [place] and has an MRI scheduled.  He has also seen a chiropractor for the knee and did get some benefit with laser therapy.  Continues to have difficulty with tinnitus.  He has underlying OSA and is on a CPAP with good results.  This is being handled by the [place].  He does have allergies that tend to bother him mainly in the spring.  He continues on Norvasc as well as atorvastatin and is having no difficulty with these medications.  His work is going well.  He has no other concerns or complaints.   Review of Systems     Objective:   Physical Exam Alert and in no distress. Tympanic membranes and canals are normal. Pharyngeal area is normal. Neck is supple without adenopathy or thyromegaly. Cardiac exam shows a regular sinus rhythm without murmurs or gallops. Lungs are clear to auscultation.        Assessment & Plan:  Allergic rhinitis, unspecified seasonality, unspecified trigger - Plan: CBC with Differential/Platelet, Comprehensive metabolic panel  Hyperlipidemia, unspecified hyperlipidemia type - Plan: Lipid panel, atorvastatin (LIPITOR) 20 MG tablet  Sleep apnea, unspecified type  Tinnitus of both ears  Essential hypertension - Plan: CBC with Differential/Platelet, Comprehensive metabolic panel, amLODipine (NORVASC) 5 MG tablet [facility].  I explained that we still do not have any therapy for his ringing in the years.  He will continue on his CPAP.  Will renew his medications.

## 2017-06-02 LAB — CBC WITH DIFFERENTIAL/PLATELET
BASOS ABS: 0 10*3/uL (ref 0.0–0.2)
Basos: 0 %
EOS (ABSOLUTE): 0.1 10*3/uL (ref 0.0–0.4)
Eos: 1 %
Hematocrit: 42.8 % (ref 37.5–51.0)
Hemoglobin: 14.2 g/dL (ref 13.0–17.7)
Immature Grans (Abs): 0 10*3/uL (ref 0.0–0.1)
Immature Granulocytes: 0 %
Lymphocytes Absolute: 2.4 10*3/uL (ref 0.7–3.1)
Lymphs: 36 %
MCH: 23.7 pg — AB (ref 26.6–33.0)
MCHC: 33.2 g/dL (ref 31.5–35.7)
MCV: 72 fL — ABNORMAL LOW (ref 79–97)
MONOS ABS: 0.4 10*3/uL (ref 0.1–0.9)
Monocytes: 6 %
NEUTROS ABS: 3.7 10*3/uL (ref 1.4–7.0)
Neutrophils: 57 %
Platelets: 162 10*3/uL (ref 150–379)
RBC: 5.99 x10E6/uL — AB (ref 4.14–5.80)
RDW: 17.2 % — AB (ref 12.3–15.4)
WBC: 6.6 10*3/uL (ref 3.4–10.8)

## 2017-06-02 LAB — COMPREHENSIVE METABOLIC PANEL
ALT: 24 IU/L (ref 0–44)
AST: 19 IU/L (ref 0–40)
Albumin/Globulin Ratio: 1.5 (ref 1.2–2.2)
Albumin: 4.6 g/dL (ref 3.6–4.8)
Alkaline Phosphatase: 107 IU/L (ref 39–117)
BILIRUBIN TOTAL: 0.4 mg/dL (ref 0.0–1.2)
BUN/Creatinine Ratio: 11 (ref 10–24)
BUN: 14 mg/dL (ref 8–27)
CHLORIDE: 102 mmol/L (ref 96–106)
CO2: 24 mmol/L (ref 20–29)
CREATININE: 1.3 mg/dL — AB (ref 0.76–1.27)
Calcium: 9.7 mg/dL (ref 8.6–10.2)
GFR calc Af Amer: 68 mL/min/{1.73_m2} (ref >59)
GFR calc non Af Amer: 58 mL/min/{1.73_m2} — ABNORMAL LOW (ref >59)
GLUCOSE: 82 mg/dL (ref 65–99)
Globulin, Total: 3 g/dL (ref 1.5–4.5)
Potassium: 4.3 mmol/L (ref 3.5–5.2)
SODIUM: 142 mmol/L (ref 134–144)
Total Protein: 7.6 g/dL (ref 6.0–8.5)

## 2017-06-02 LAB — LIPID PANEL
CHOLESTEROL TOTAL: 164 mg/dL (ref 100–199)
Chol/HDL Ratio: 4.2 ratio (ref 0.0–5.0)
HDL: 39 mg/dL — ABNORMAL LOW (ref >39)
LDL CALC: 96 mg/dL (ref 0–99)
TRIGLYCERIDES: 145 mg/dL (ref 0–149)
VLDL Cholesterol Cal: 29 mg/dL (ref 5–40)

## 2017-08-24 ENCOUNTER — Encounter: Payer: Self-pay | Admitting: Family Medicine

## 2017-08-24 ENCOUNTER — Ambulatory Visit: Payer: BC Managed Care – PPO | Admitting: Family Medicine

## 2017-08-24 VITALS — BP 152/82 | HR 73 | Temp 98.4°F | Ht 69.0 in | Wt 208.8 lb

## 2017-08-24 DIAGNOSIS — H6123 Impacted cerumen, bilateral: Secondary | ICD-10-CM | POA: Diagnosis not present

## 2017-08-24 DIAGNOSIS — L989 Disorder of the skin and subcutaneous tissue, unspecified: Secondary | ICD-10-CM | POA: Diagnosis not present

## 2017-08-24 NOTE — Progress Notes (Signed)
   Subjective:    Patient ID: Antonio Harvey, male    DOB: [birth year], 62 y.o.   MRN: [id number]  HPI He was bitten by an insect a week or 2 ago subsequently developed some slight but not redness DIP joint of the right hand.   Review of Systems     Objective:   Physical Exam Alert and in no distress.  Exam of the right thumb shows normal motion.  He does have a 1 cm round smooth relatively movable lesion that is slightly tender to palpation.       Assessment & Plan:  Thumb lesion Lesion actually feels like a ganglion although it could possibly be a dermatofibroma.  I reassured him that he was not in any danger and recommended leaving it alone.  Discussed possible follow-up especially if it starts to give him more difficulty.

## 2017-11-30 DIAGNOSIS — M25561 Pain in right knee: Secondary | ICD-10-CM | POA: Insufficient documentation

## 2017-12-18 ENCOUNTER — Other Ambulatory Visit: Payer: Self-pay | Admitting: Family Medicine

## 2017-12-18 DIAGNOSIS — I10 Essential (primary) hypertension: Secondary | ICD-10-CM

## 2018-06-21 ENCOUNTER — Other Ambulatory Visit: Payer: Self-pay | Admitting: Family Medicine

## 2018-06-21 DIAGNOSIS — I10 Essential (primary) hypertension: Secondary | ICD-10-CM

## 2018-06-25 ENCOUNTER — Encounter: Payer: Self-pay | Admitting: Family Medicine

## 2018-06-25 ENCOUNTER — Ambulatory Visit: Payer: BC Managed Care – PPO | Admitting: Family Medicine

## 2018-06-25 VITALS — BP 130/80 | HR 63 | Temp 98.0°F | Wt 212.2 lb

## 2018-06-25 DIAGNOSIS — I10 Essential (primary) hypertension: Secondary | ICD-10-CM | POA: Diagnosis not present

## 2018-06-25 DIAGNOSIS — H9313 Tinnitus, bilateral: Secondary | ICD-10-CM

## 2018-06-25 DIAGNOSIS — J309 Allergic rhinitis, unspecified: Secondary | ICD-10-CM

## 2018-06-25 DIAGNOSIS — G4730 Sleep apnea, unspecified: Secondary | ICD-10-CM | POA: Diagnosis not present

## 2018-06-25 DIAGNOSIS — Z86010 Personal history of colonic polyps: Secondary | ICD-10-CM

## 2018-06-25 DIAGNOSIS — M67441 Ganglion, right hand: Secondary | ICD-10-CM

## 2018-06-25 DIAGNOSIS — K5730 Diverticulosis of large intestine without perforation or abscess without bleeding: Secondary | ICD-10-CM

## 2018-06-25 DIAGNOSIS — Z23 Encounter for immunization: Secondary | ICD-10-CM

## 2018-06-25 DIAGNOSIS — E785 Hyperlipidemia, unspecified: Secondary | ICD-10-CM

## 2018-06-25 MED ORDER — AMLODIPINE BESYLATE 5 MG PO TABS: 5.0000 mg | ORAL_TABLET | Freq: Every day | ORAL | 3 refills | Status: DC

## 2018-06-25 MED ORDER — ATORVASTATIN CALCIUM 20 MG PO TABS: 20.0000 mg | ORAL_TABLET | Freq: Every day | ORAL | 3 refills | Status: DC

## 2018-06-25 NOTE — Progress Notes (Signed)
   Subjective:    Patient ID: Antonio Harvey, male    DOB: 17-Jan-1956, 63 y.o.   MRN: [id number]  HPI He is here for medication management visit.  He still would like the lesion on his left thumb taken care of.  It is not causing any problems but he would still like it taken care of.  He does have underlying OSA and is on CPAP.  He has been having difficulty with sleep and has a subsequent follow-up appointment with the [facility] concerning this.  Continues on his blood pressure medication as well as statin and having no difficulty with that does have a previous history of polyps and apparently did have a recent colonoscopy.  Continues have difficulty with tinnitus.  His allergies seem to be under good control.  There is also history of diverticulosis.   Review of Systems     Objective:   Physical Exam Alert and in no distress. Tympanic membranes and canals are normal. Pharyngeal area is normal. Neck is supple without adenopathy or thyromegaly. Cardiac exam shows a regular sinus rhythm without murmurs or gallops. Lungs are clear to auscultation. Exam of the right thumb does show a 1 and half centimeter fluctuant easily movable nontender lesion over the DIP joint laterally.       Assessment & Plan:  Ganglion cyst of finger of right hand - Plan: Ambulatory referral to Orthopedic Surgery  Need for shingles vaccine - Plan: Varicella-zoster vaccine IM (Shingrix)  Sleep apnea, unspecified type  Essential hypertension - Plan: CBC with Differential/Platelet, Comprehensive metabolic panel, amLODipine (NORVASC) 5 MG tablet  Hyperlipidemia, unspecified hyperlipidemia type - Plan: Lipid panel, atorvastatin (LIPITOR) 20 MG tablet  History of colonic polyps  Diverticulosis of colon without hemorrhage  Tinnitus of both ears  Allergic rhinitis, unspecified seasonality, unspecified trigger He will continue on his present medication regimen.  We will also be referred to orthopedics for excision of the  ganglion. He will keep me informed concerning the sleep disturbance.

## 2018-06-26 LAB — COMPREHENSIVE METABOLIC PANEL
A/G RATIO: 1.6 (ref 1.2–2.2)
ALBUMIN: 4.7 g/dL (ref 3.8–4.8)
ALT: 21 IU/L (ref 0–44)
AST: 32 IU/L (ref 0–40)
Alkaline Phosphatase: 125 IU/L — ABNORMAL HIGH (ref 39–117)
BUN / CREAT RATIO: 13 (ref 10–24)
BUN: 15 mg/dL (ref 8–27)
Bilirubin Total: 0.3 mg/dL (ref 0.0–1.2)
CO2: 22 mmol/L (ref 20–29)
Calcium: 9.5 mg/dL (ref 8.6–10.2)
Chloride: 103 mmol/L (ref 96–106)
Creatinine, Ser: 1.15 mg/dL (ref 0.76–1.27)
GFR, EST AFRICAN AMERICAN: 78 mL/min/{1.73_m2} (ref >59)
GFR, EST NON AFRICAN AMERICAN: 67 mL/min/{1.73_m2} (ref >59)
GLOBULIN, TOTAL: 2.9 g/dL (ref 1.5–4.5)
Glucose: 110 mg/dL — ABNORMAL HIGH (ref 65–99)
POTASSIUM: 4 mmol/L (ref 3.5–5.2)
SODIUM: 141 mmol/L (ref 134–144)
Total Protein: 7.6 g/dL (ref 6.0–8.5)

## 2018-06-26 LAB — CBC WITH DIFFERENTIAL/PLATELET
BASOS: 0 %
Basophils Absolute: 0 10*3/uL (ref 0.0–0.2)
EOS (ABSOLUTE): 0 10*3/uL (ref 0.0–0.4)
EOS: 1 %
HEMATOCRIT: 43.5 % (ref 37.5–51.0)
HEMOGLOBIN: 14.3 g/dL (ref 13.0–17.7)
IMMATURE GRANULOCYTES: 0 %
Immature Grans (Abs): 0 10*3/uL (ref 0.0–0.1)
LYMPHS ABS: 2.3 10*3/uL (ref 0.7–3.1)
Lymphs: 38 %
MCH: 23.8 pg — ABNORMAL LOW (ref 26.6–33.0)
MCHC: 32.9 g/dL (ref 31.5–35.7)
MCV: 73 fL — AB (ref 79–97)
MONOCYTES: 9 %
Monocytes Absolute: 0.5 10*3/uL (ref 0.1–0.9)
NEUTROS PCT: 52 %
Neutrophils Absolute: 3.1 10*3/uL (ref 1.4–7.0)
Platelets: 189 10*3/uL (ref 150–450)
RBC: 6 x10E6/uL — AB (ref 4.14–5.80)
RDW: 18 % — ABNORMAL HIGH (ref 11.6–15.4)
WBC: 6 10*3/uL (ref 3.4–10.8)

## 2018-06-26 LAB — LIPID PANEL
CHOL/HDL RATIO: 3.4 ratio (ref 0.0–5.0)
Cholesterol, Total: 146 mg/dL (ref 100–199)
HDL: 43 mg/dL (ref >39)
LDL Calculated: 65 mg/dL (ref 0–99)
Triglycerides: 189 mg/dL — ABNORMAL HIGH (ref 0–149)
VLDL Cholesterol Cal: 38 mg/dL (ref 5–40)

## 2018-07-03 ENCOUNTER — Other Ambulatory Visit: Payer: Self-pay

## 2018-07-03 ENCOUNTER — Encounter (INDEPENDENT_AMBULATORY_CARE_PROVIDER_SITE_OTHER): Payer: Self-pay | Admitting: Orthopaedic Surgery

## 2018-07-03 ENCOUNTER — Ambulatory Visit (INDEPENDENT_AMBULATORY_CARE_PROVIDER_SITE_OTHER): Payer: BC Managed Care – PPO | Admitting: Orthopaedic Surgery

## 2018-07-03 ENCOUNTER — Ambulatory Visit (INDEPENDENT_AMBULATORY_CARE_PROVIDER_SITE_OTHER): Payer: BC Managed Care – PPO

## 2018-07-03 DIAGNOSIS — R2231 Localized swelling, mass and lump, right upper limb: Secondary | ICD-10-CM | POA: Diagnosis not present

## 2018-07-03 NOTE — Progress Notes (Signed)
Office Visit Note   Patient: Antonio Harvey           Date of Birth: 1955-12-25           MRN: [id number] Visit Date: 07/03/2018              Requested by: Antonio Lung, MD [address] [place], [place] [address] PCP: Antonio Lung, MD   Assessment & Plan: Visit Diagnoses:  1. Mass of skin of right thumb     Plan: Impression is right thumb epidermal cyst from injury within the last 6 months.  We discussed that since it is not symptomatic that it would be fine to leave it alone and just monitor but he states that he does not like the appearance of it and would like to have it removed.  He understands the potential risks of infection, scar tenderness, reoccurrence and wishes to proceed with surgery.  Follow-Up Instructions: Return for 2 week postop visit.   Orders:  Orders Placed This Encounter  Procedures  . XR Finger Thumb Right   No orders of the defined types were placed in this encounter.     Procedures: No procedures performed   Clinical Data: No additional findings.   Subjective: Chief Complaint  Patient presents with  . Right Thumb - Cyst    Antonio Harvey is a very pleasant 63 year old gentleman who I have seen in the past and removed a ganglion cyst from his left middle thumb who comes in with a newly developing cyst from last 6 to 7 months on his right thumb.  He states that he was stung by some sort of insect at that time and the exact location and shortly thereafter he started developing a mass.  He denies any constitutional symptoms.  The mass has no longer enlarged and the size has been stable.  He denies any drainage or any skin problems.  He states that this does not cause him any pain but he does not like the way it looks and wants it removed.   Review of Systems  Constitutional: Negative.   All other systems reviewed and are negative.    Objective: Vital Signs: There were no vitals taken for this visit.  Physical Exam Vitals signs and  nursing note reviewed.  Constitutional:      Appearance: He is well-developed.  Pulmonary:     Effort: Pulmonary effort is normal.  Abdominal:     Palpations: Abdomen is soft.  Skin:    General: Skin is warm.  Neurological:     Mental Status: He is alert and oriented to person, place, and time.  Psychiatric:        Behavior: Behavior normal.        Thought Content: Thought content normal.        Judgment: Judgment normal.     Ortho Exam Right thumb exam shows a obvious mass on the radial aspect of the IP joint.  He has full range of motion of the IP joint and his exam is unremarkable other than the palpable mass.  This is not tender to palpation.  There are no overlying skin changes. Specialty Comments:  No specialty comments available.  Imaging: Xr Finger Thumb Right  Result Date: 07/03/2018 Mild osteoarthritis of IP joint with periarticular spurring    PMFS History: Patient Active Problem List   Diagnosis Date Noted  . Allergic rhinitis 05/09/2016  . History of colonic polyps 07/24/2014  . Diverticulosis of colon without hemorrhage 07/24/2014  .  Tinnitus 04/26/2013  . Hyperlipidemia 01/25/2011  . Sleep apnea 01/25/2011   Past Medical History:  Diagnosis Date  . Allergy   . Chronic LBP   . Diverticulosis   . History of colonic polyps   . Hyperlipidemia   . Sleep apnea     Family History  Problem Relation Age of Onset  . Asthma Mother   . Hypertension Mother   . Stroke Father        died of CVA  . Kidney disease Father        dialysis  . Diabetes Father   . Arthritis Sister   . Colon cancer Neg Hx     Past Surgical History:  Procedure Laterality Date  . GANGLION CYST EXCISION Left 05/27/2015   Procedure: REMOVAL GANGLION CYST OF LEFT 3RD FINGER;  Surgeon: Leandrew Koyanagi, MD;  Location: [facility];  Service: Orthopedics;  Laterality: Left;  . INCISION AND DRAINAGE PERIRECTAL ABSCESS N/A 06/28/2013   Procedure: IRRIGATION AND DEBRIDEMENT  PERIRECTAL ABSCESS;  Surgeon: Harl Bowie, MD;  Location: [facility];  Service: General;  Laterality: N/A;  . TONSILLECTOMY AND ADENOIDECTOMY  1966   Social History   Occupational History  . Not on file  Tobacco Use  . Smoking status: Never Smoker  . Smokeless tobacco: Never Used  Substance and Sexual Activity  . Alcohol use: No  . Drug use: No  . Sexual activity: Yes

## 2018-08-27 ENCOUNTER — Other Ambulatory Visit (INDEPENDENT_AMBULATORY_CARE_PROVIDER_SITE_OTHER): Payer: BC Managed Care – PPO

## 2018-08-27 ENCOUNTER — Other Ambulatory Visit: Payer: Self-pay

## 2018-08-27 DIAGNOSIS — Z23 Encounter for immunization: Secondary | ICD-10-CM

## 2019-04-08 ENCOUNTER — Telehealth: Payer: Self-pay | Admitting: Family Medicine

## 2019-04-08 NOTE — Telephone Encounter (Signed)
LVM for pt to call back to office for covid instructions. [place]

## 2019-04-08 NOTE — Telephone Encounter (Signed)
Check with him and see how he is doing and given the rules on how long he needs to stay sequestered.

## 2019-04-08 NOTE — Telephone Encounter (Signed)
COVID positive since Saturday with [facility] he is quarantining.   Family is negative.

## 2019-04-08 NOTE — Telephone Encounter (Signed)
Pt stated he is improved. Pt only had soreness in back. Today makes the seventh day and family was testes over the weekend and all came back negative . Still not symptom free and will reschedule labs for this week. [place]

## 2019-04-10 ENCOUNTER — Encounter: Payer: BC Managed Care – PPO | Admitting: Family Medicine

## 2019-04-15 ENCOUNTER — Ambulatory Visit: Payer: BC Managed Care – PPO | Attending: Internal Medicine

## 2019-04-15 DIAGNOSIS — Z20822 Contact with and (suspected) exposure to covid-19: Secondary | ICD-10-CM

## 2019-04-17 ENCOUNTER — Other Ambulatory Visit: Payer: Self-pay

## 2019-04-17 ENCOUNTER — Encounter: Payer: Self-pay | Admitting: Family Medicine

## 2019-04-17 ENCOUNTER — Ambulatory Visit (INDEPENDENT_AMBULATORY_CARE_PROVIDER_SITE_OTHER): Payer: BC Managed Care – PPO | Admitting: Family Medicine

## 2019-04-17 VITALS — Temp 97.6°F | Wt 202.0 lb

## 2019-04-17 DIAGNOSIS — U071 COVID-19: Secondary | ICD-10-CM

## 2019-04-17 LAB — NOVEL CORONAVIRUS, NAA: SARS-CoV-2, NAA: NOT DETECTED

## 2019-04-17 NOTE — Progress Notes (Signed)
   Subjective:    Patient ID: Antonio Harvey, male    DOB: [birth year], 63 y.o.   MRN: [id number]  HPI Documentation for virtual telephone encounter.  Documentation for virtual audio and video telecommunications through [employer] encounter: The patient was located at home. The provider was located in the office. The patient did consent to this visit and is aware of possible charges through their insurance for this visit. The other persons participating in this telemedicine service were none. This virtual service is not related to other E/M service within previous 7 days. He was tested for [facility] December 15 with a positive result.  He did this because he was having some back pain as well as some nasal congestion and decreased smell.  The symptoms went away after a few days.  He has had no fever, shortness of breath or cough.  He is now totally asymptomatic and will need a note for work.    Review of Systems     Objective:   Physical Exam Alert and in no distress with normal speech and no evidence of shortness of breath.       Assessment & Plan:  COVID-19 I will give him a note so he can return to work.

## 2019-07-07 ENCOUNTER — Other Ambulatory Visit: Payer: Self-pay | Admitting: Family Medicine

## 2019-07-07 DIAGNOSIS — I10 Essential (primary) hypertension: Secondary | ICD-10-CM

## 2019-07-07 DIAGNOSIS — E785 Hyperlipidemia, unspecified: Secondary | ICD-10-CM

## 2019-08-20 ENCOUNTER — Encounter: Payer: Self-pay | Admitting: Family Medicine

## 2019-08-20 ENCOUNTER — Ambulatory Visit (INDEPENDENT_AMBULATORY_CARE_PROVIDER_SITE_OTHER): Payer: BC Managed Care – PPO | Admitting: Family Medicine

## 2019-08-20 ENCOUNTER — Other Ambulatory Visit: Payer: Self-pay

## 2019-08-20 VITALS — BP 128/82 | HR 68 | Temp 98.4°F | Wt 201.6 lb

## 2019-08-20 DIAGNOSIS — N529 Male erectile dysfunction, unspecified: Secondary | ICD-10-CM | POA: Diagnosis not present

## 2019-08-20 DIAGNOSIS — I10 Essential (primary) hypertension: Secondary | ICD-10-CM | POA: Diagnosis not present

## 2019-08-20 DIAGNOSIS — E7439 Other disorders of intestinal carbohydrate absorption: Secondary | ICD-10-CM

## 2019-08-20 DIAGNOSIS — E785 Hyperlipidemia, unspecified: Secondary | ICD-10-CM

## 2019-08-20 MED ORDER — TADALAFIL 20 MG PO TABS: 20.0000 mg | ORAL_TABLET | Freq: Every day | ORAL | 0 refills | Status: AC | PRN

## 2019-08-20 MED ORDER — LOSARTAN POTASSIUM 50 MG PO TABS: 50.0000 mg | ORAL_TABLET | Freq: Every day | ORAL | 3 refills | Status: DC

## 2019-08-20 MED ORDER — ATORVASTATIN CALCIUM 40 MG PO TABS: 40.0000 mg | ORAL_TABLET | Freq: Every day | ORAL | 3 refills | Status: DC

## 2019-08-20 NOTE — Patient Instructions (Signed)
Diet and exercise is the key to this and he exercises 20 minutes of something physical every day or 150 minutes week of something. In terms of diet, cut back on carbohydrates and the easiest way to remember it is cut back on white food Go to the American diabetes Association website to get information.

## 2019-08-20 NOTE — Progress Notes (Signed)
Subjective:    Patient ID: Antonio Harvey, male    DOB: 12/12/1955, 64 y.o.   MRN: [id number]  HPI He is here for consult concerning erectile dysfunction.  He is really not having libido issues but difficulty getting and maintaining erections.  He would like some help with that.  He also brought in lab data from recent visit to the [facility] [place].  The data including CBC, C met and lipids was reviewed.  He does have a hemoglobin A1c of 6.5.  He has started into a regular exercise program recently.  He plans to continue this.  He is presently taking Norvasc and Lipitor and having no difficulty with that.   Review of Systems     Objective:   Physical Exam Alert and in no distress otherwise not examined       Assessment & Plan:  Glucose intolerance  Essential hypertension - Plan: losartan (COZAAR) 50 MG tablet  Hyperlipidemia, unspecified hyperlipidemia type - Plan: atorvastatin (LIPITOR) 40 MG tablet  Erectile dysfunction, unspecified erectile dysfunction type - Plan: tadalafil (CIALIS) 20 MG tablet I had a long discussion with him concerning his blood work including his hemoglobin A1c.  I will label him as glucose intolerant.  He plans to work on diet and exercise.  Also recommend he go to the American diabetes Association website to get more education on diabetes.  I will switch him to Cozaar and will increase his Lipitor.  Also discussed the use of Cialis for his erectile dysfunction.  He will return here in about 4 months for a recheck.  I anticipate his A1c will be lower.  He was comfortable with this approach.  Over 30 minutes spent discussing all of these issues with him.

## 2019-08-23 ENCOUNTER — Telehealth: Payer: Self-pay | Admitting: Family Medicine

## 2019-08-23 MED ORDER — SILDENAFIL CITRATE 100 MG PO TABS: 50.0000 mg | ORAL_TABLET | Freq: Every day | ORAL | 11 refills | Status: DC | PRN

## 2019-08-23 NOTE — Telephone Encounter (Signed)
Pt called and wanted to see if there is another alternative for his Cialis because his insurance doesn't cover it

## 2019-08-23 NOTE — Addendum Note (Signed)
Addended by: Denita Lung on: 08/23/2019 10:13 PM   Modules accepted: Orders

## 2019-08-26 ENCOUNTER — Telehealth: Payer: Self-pay

## 2019-08-26 NOTE — Telephone Encounter (Signed)
Pt. Called back stating to go ahead and cancel both of those prescriptions for sildenafil and tadalafil at [facility] and he is going to try to get them at the [facility] now.

## 2019-08-26 NOTE — Telephone Encounter (Signed)
I submitted a PA to the pts. Insurance company for his Tadalafil 20 mg and it was denied because it is not covered by the pts. Plan, plan exclusion. Looks like you already changed pts. Medication to sildenafil, so we should be ok now.

## 2019-08-26 NOTE — Telephone Encounter (Signed)
Pt advised to cancel both scripts on tadalail and sildenafil. He will try the [facility] [place]. [place]

## 2019-08-26 NOTE — Telephone Encounter (Signed)
Spoke to [employer] and the pharmacist advised that when she runs either sildenafil or tadalafil it is rejecting them both and stating that the other is preferred . Will advise pt to contact insurance to clear up and contact office for new script. [place]   LVM for pt . [place]

## 2019-09-13 ENCOUNTER — Encounter (HOSPITAL_COMMUNITY): Payer: Self-pay | Admitting: Emergency Medicine

## 2019-09-13 ENCOUNTER — Other Ambulatory Visit: Payer: Self-pay

## 2019-09-13 ENCOUNTER — Emergency Department (HOSPITAL_COMMUNITY)
Admission: EM | Admit: 2019-09-13 | Discharge: 2019-09-13 | Disposition: A | Discharge status: Home / Self Care | Payer: BC Managed Care – PPO | Attending: Emergency Medicine | Admitting: Emergency Medicine

## 2019-09-13 DIAGNOSIS — I10 Essential (primary) hypertension: Secondary | ICD-10-CM | POA: Diagnosis not present

## 2019-09-13 DIAGNOSIS — Y93H2 Activity, gardening and landscaping: Secondary | ICD-10-CM | POA: Diagnosis not present

## 2019-09-13 DIAGNOSIS — Z79899 Other long term (current) drug therapy: Secondary | ICD-10-CM | POA: Insufficient documentation

## 2019-09-13 DIAGNOSIS — Y92007 Garden or yard of unspecified non-institutional (private) residence as the place of occurrence of the external cause: Secondary | ICD-10-CM | POA: Insufficient documentation

## 2019-09-13 DIAGNOSIS — S0502XA Injury of conjunctiva and corneal abrasion without foreign body, left eye, initial encounter: Secondary | ICD-10-CM | POA: Diagnosis not present

## 2019-09-13 DIAGNOSIS — Y999 Unspecified external cause status: Secondary | ICD-10-CM | POA: Diagnosis not present

## 2019-09-13 DIAGNOSIS — W208XXA Other cause of strike by thrown, projected or falling object, initial encounter: Secondary | ICD-10-CM | POA: Insufficient documentation

## 2019-09-13 DIAGNOSIS — H5712 Ocular pain, left eye: Secondary | ICD-10-CM | POA: Diagnosis present

## 2019-09-13 MED ORDER — FLUORESCEIN SODIUM 1 MG OP STRP
1.0000 | ORAL_STRIP | Freq: Once | OPHTHALMIC | Status: AC
  Administered 2019-09-13: 1 via OPHTHALMIC
  Filled 2019-09-13: qty 1

## 2019-09-13 MED ORDER — TETRACAINE HCL 0.5 % OP SOLN
2.0000 [drp] | Freq: Once | OPHTHALMIC | Status: AC
  Administered 2019-09-13: 2 [drp] via OPHTHALMIC
  Filled 2019-09-13: qty 4

## 2019-09-13 MED ORDER — ERYTHROMYCIN 5 MG/GM OP OINT
TOPICAL_OINTMENT | Freq: Once | OPHTHALMIC | Status: AC
  Administered 2019-09-13: 1 via OPHTHALMIC
  Filled 2019-09-13: qty 3.5

## 2019-09-13 NOTE — ED Provider Notes (Signed)
[facility] DEPT Provider Note   CSN: [id number] Arrival date & time: 09/13/19  2101     History Chief Complaint  Patient presents with  . Eye Pain    Antonio Harvey is a 64 y.o. male with PMHx HLD and sleep apnea who presents to the ED today with complaint of sudden onset, constant, achy, left eye pain that began yesterday.  She reports he was mowing the grass when he felt something fly into his eye.  He states he had pain afterwards.  He tried to flush out his eye without relief.  Does report that his eye will sometimes get watery due to the pain causing some blurry vision however when it does not water he has no blurry or double vision.  Patient does not wear contacts.  He sometimes wears reading glasses and is followed by Dr. Zenia Resides office ophthalmology.  She denies any drainage to the eye.  No fevers or chills.  No other complaints at this time.   The history is provided by the patient and medical records.       Past Medical History:  Diagnosis Date  . Allergy   . Chronic LBP   . Diverticulosis   . History of colonic polyps   . Hyperlipidemia   . Sleep apnea     Patient Active Problem List   Diagnosis Date Noted  . Essential hypertension 08/20/2019  . Glucose intolerance 08/20/2019  . Erectile dysfunction 08/20/2019  . Pain in right knee 11/30/2017  . Allergic rhinitis 05/09/2016  . History of colonic polyps 07/24/2014  . Diverticulosis of colon without hemorrhage 07/24/2014  . Tinnitus 04/26/2013  . Hyperlipidemia 01/25/2011  . Sleep apnea 01/25/2011    Past Surgical History:  Procedure Laterality Date  . GANGLION CYST EXCISION Left 05/27/2015   Procedure: REMOVAL GANGLION CYST OF LEFT 3RD FINGER;  Surgeon: Leandrew Koyanagi, MD;  Location: [facility];  Service: Orthopedics;  Laterality: Left;  . INCISION AND DRAINAGE PERIRECTAL ABSCESS N/A 06/28/2013   Procedure: IRRIGATION AND DEBRIDEMENT PERIRECTAL ABSCESS;  Surgeon:  Harl Bowie, MD;  Location: [facility];  Service: General;  Laterality: N/A;  . TONSILLECTOMY AND ADENOIDECTOMY  1966       Family History  Problem Relation Age of Onset  . Asthma Mother   . Hypertension Mother   . Stroke Father        died of CVA  . Kidney disease Father        dialysis  . Diabetes Father   . Arthritis Sister   . Colon cancer Neg Hx     Social History   Tobacco Use  . Smoking status: Never Smoker  . Smokeless tobacco: Never Used  Substance Use Topics  . Alcohol use: No  . Drug use: No    Home Medications Prior to Admission medications   Medication Sig Start Date End Date Taking? Authorizing Provider  acetaminophen (TYLENOL) 500 MG tablet Take 500 mg by mouth every 6 (six) hours as needed.    [provider]  atorvastatin (LIPITOR) 40 MG tablet Take 1 tablet (40 mg total) by mouth daily. 08/20/19   Denita Lung, MD  fluticasone (FLONASE) 50 MCG/ACT nasal spray fluticasone propionate 50 mcg/actuation nasal spray,suspension    [provider]  losartan (COZAAR) 50 MG tablet Take 1 tablet (50 mg total) by mouth daily. 08/20/19   Denita Lung, MD  sildenafil (VIAGRA) 100 MG tablet Take 0.5-1 tablets (50-100 mg total)  by mouth daily as needed for erectile dysfunction. 08/23/19   Denita Lung, MD  tadalafil (CIALIS) 20 MG tablet Take 1 tablet (20 mg total) by mouth daily as needed for erectile dysfunction. 08/20/19   Denita Lung, MD    Allergies    Patient has no known allergies.  Review of Systems   Review of Systems  Constitutional: Negative for chills and fever.  Eyes: Positive for pain and redness. Negative for visual disturbance.    Physical Exam Updated Vital Signs BP (!) 159/79 (BP Location: Left Arm)   Pulse 63   Temp 98.7 F (37.1 C) (Oral)   Resp 18   Ht 5\' 9"  (1.753 m)   Wt 91.2 kg   SpO2 98%   BMI 29.68 kg/m   Physical Exam Vitals and nursing note reviewed.  Constitutional:      Appearance: He is not  ill-appearing.  HENT:     Head: Normocephalic and atraumatic.  Eyes:     Comments: Left eye with injected conjunctiva. No obvious FB seen with inspection of eye. PERRL. EOMI.   + Fluorescein uptake at about 8 o'clock on iris   Visual Acuity Bilateral Near:20/20 Bilateral Distance:20/30 R Near:20/20 R Distance:20/40 L Near:20/30 L Distance:20/40  Cardiovascular:     Rate and Rhythm: Normal rate and regular rhythm.  Pulmonary:     Effort: Pulmonary effort is normal.     Breath sounds: Normal breath sounds.  Skin:    General: Skin is warm and dry.     Coloration: Skin is not jaundiced.  Neurological:     Mental Status: He is alert.     ED Results / Procedures / Treatments   Labs (all labs ordered are listed, but only abnormal results are displayed) Labs Reviewed - No data to display  EKG None  Radiology No results found.  Procedures Procedures (including critical care time)  Medications Ordered in ED Medications  fluorescein ophthalmic strip 1 strip (1 strip Left Eye Given by Other 09/13/19 2237)  tetracaine (PONTOCAINE) 0.5 % ophthalmic solution 2 drop (2 drops Left Eye Given by Other 09/13/19 2237)  erythromycin ophthalmic ointment (1 application Left Eye Given 09/13/19 2246)    ED Course  I have reviewed the triage vital signs and the nursing notes.  Pertinent labs & imaging results that were available during my care of the patient were reviewed by me and considered in my medical decision making (see chart for details).    MDM Rules/Calculators/A&P                      64 year old male who presents to the ED today with complaint of foreign body sensation in his left eye after mowing grass yesterday.  Felt something fly into his eye and has been unable to wash it down at home.  No blurry vision, double vision, drainage from eye.  No fevers or chills.  No pain with extraocular movements.  Arrival to the ED patient is afebrile, nontachycardic and nontachypneic.   He appears to be in no acute distress.  Exam patient has no obvious foreign body with inspection.  Will perform fluorescein test at this time and reevaluate as I suspect there will be a corneal abrasion given history.   Visual acuity without any changes in left eye compared to right.  Also no fluorescein uptake to left eye at around 8:00 on the iris consistent with patient's pain.  Will discharge home with erythromycin ointment.  Patient is  already follow-up with ophthalmology for prescription reading glasses that he uses as needed.  No contacts.  Advised that he follow-up with them for further evaluation.  Patient is in agreement with plan is stable for discharge home.   This note was prepared using Dragon voice recognition software and may include unintentional dictation errors due to the inherent limitations of voice recognition software.  Final Clinical Impression(s) / ED Diagnoses Final diagnoses:  Abrasion of left cornea, initial encounter    Rx / DC Orders ED Discharge Orders    None       Discharge Instructions     Please use antibiotic eye drops 4x per day by applying 1 cm ribbon of ointment into eye. You will need to do this for the next 7 days.   Follow up with your ophthalmologist for recheck after you finish the eye drops  Return to the ED IMMEDIATELY for any worsening symptoms including worsening pain, vision changes, pus draining from your eye, or any other new/concerning symptoms.        Eustaquio Maize, PA-C 09/13/19 2247    Carmin Muskrat, MD 09/13/19 [phone]

## 2019-09-13 NOTE — Discharge Instructions (Signed)
Please use antibiotic eye drops 4x per day by applying 1 cm ribbon of ointment into eye. You will need to do this for the next 7 days.   Follow up with your ophthalmologist for recheck after you finish the eye drops  Return to the ED IMMEDIATELY for any worsening symptoms including worsening pain, vision changes, pus draining from your eye, or any other new/concerning symptoms.

## 2019-09-13 NOTE — ED Triage Notes (Signed)
Patient reports foreign body to left eye while mowing yesterday. C/o pain to eye. Denies blurred vision. Conjunctiva red.

## 2019-12-02 ENCOUNTER — Encounter: Payer: Self-pay | Admitting: Family Medicine

## 2019-12-02 ENCOUNTER — Ambulatory Visit (INDEPENDENT_AMBULATORY_CARE_PROVIDER_SITE_OTHER): Payer: Medicare PPO | Admitting: Family Medicine

## 2019-12-02 VITALS — BP 152/76 | HR 62 | Temp 97.7°F | Wt 205.0 lb

## 2019-12-02 DIAGNOSIS — M549 Dorsalgia, unspecified: Secondary | ICD-10-CM | POA: Diagnosis not present

## 2019-12-02 NOTE — Progress Notes (Signed)
   Subjective:    Patient ID: Antonio Harvey, male    DOB: Dec 14, 1955, 64 y.o.   MRN: [id number]  HPI He is here for evaluation of intermittent low back stiffness.  He states that he does feel stiffness if he sits for a long period of time and also when he gets up in the morning but usually within a few minutes this goes away and he is able to be physically active doing anything that he wants.  No numbness, tingling or weakness.   Review of Systems     Objective:   Physical Exam Alert and in no distress.  Full motion of the back with normal lumbar curve and motion.  Negative straight leg raising.  Normal DTRs.       Assessment & Plan:  Musculoskeletal back pain I explained that the symptoms are not unusual in a part of the aging process.  Recommend good stretching exercises.  If the symptoms get worse, Tylenol is not unreasonable.  He was comfortable with that.

## 2019-12-26 ENCOUNTER — Ambulatory Visit (INDEPENDENT_AMBULATORY_CARE_PROVIDER_SITE_OTHER): Payer: Medicare PPO | Admitting: Family Medicine

## 2019-12-26 ENCOUNTER — Encounter: Payer: Self-pay | Admitting: Family Medicine

## 2019-12-26 ENCOUNTER — Other Ambulatory Visit: Payer: Self-pay

## 2019-12-26 VITALS — BP 142/80 | HR 63 | Temp 97.0°F | Wt 204.2 lb

## 2019-12-26 DIAGNOSIS — E7439 Other disorders of intestinal carbohydrate absorption: Secondary | ICD-10-CM | POA: Diagnosis not present

## 2019-12-26 DIAGNOSIS — R7309 Other abnormal glucose: Secondary | ICD-10-CM

## 2019-12-26 DIAGNOSIS — Z23 Encounter for immunization: Secondary | ICD-10-CM | POA: Diagnosis not present

## 2019-12-26 LAB — POCT GLYCOSYLATED HEMOGLOBIN (HGB A1C): Hemoglobin A1C: 6.1 % — AB (ref 4.0–5.6)

## 2019-12-26 NOTE — Progress Notes (Signed)
   Subjective:    Patient ID: Antonio Harvey, male    DOB: 11/15/1955, 64 y.o.   MRN: [id number]  HPI He is here for consult concerning blood work which did show slightly elevated blood sugar.    Objective:   Physical Exam  Alert and in no distress.  Hemoglobin A1c is 6.1.      Assessment & Plan:  Glucose intolerance - Plan: POCT glycosylated hemoglobin (Hb A1C)  Need for influenza vaccination - Plan: Flu vaccine HIGH DOSE PF (Fluzone High dose) I discussed the diagnosis of glucose intolerance in regard to risk of diabetes.  Also discussed diet and exercise recommending 20 minutes of something physical on a daily basis as well as cutting back specifically on carbohydrates.  He is to follow-up on this in roughly 6 to 12 months.  He does use the [facility] and will probably be cared for mainly there.  Flu shot also given.

## 2020-01-15 ENCOUNTER — Other Ambulatory Visit: Payer: Self-pay | Admitting: Family Medicine

## 2020-01-15 DIAGNOSIS — I10 Essential (primary) hypertension: Secondary | ICD-10-CM

## 2020-01-15 NOTE — Telephone Encounter (Signed)
Per may visit. Pt no longer on this

## 2020-01-16 ENCOUNTER — Other Ambulatory Visit: Payer: Self-pay

## 2020-01-16 NOTE — Telephone Encounter (Signed)
Spoke to pt concerning meds. Pt was advised that Dr. Redmond [facility] started him on losartan back in may of this year. Pt just was confused. [place]

## 2020-02-09 DIAGNOSIS — Z20822 Contact with and (suspected) exposure to covid-19: Secondary | ICD-10-CM | POA: Diagnosis not present

## 2020-04-27 ENCOUNTER — Encounter: Payer: Self-pay | Admitting: Family Medicine

## 2020-04-28 DIAGNOSIS — Z20822 Contact with and (suspected) exposure to covid-19: Secondary | ICD-10-CM | POA: Diagnosis not present

## 2020-05-07 ENCOUNTER — Ambulatory Visit: Payer: Medicare PPO | Admitting: Family Medicine

## 2020-06-22 ENCOUNTER — Other Ambulatory Visit: Payer: Self-pay | Admitting: Family Medicine

## 2020-06-22 DIAGNOSIS — E785 Hyperlipidemia, unspecified: Secondary | ICD-10-CM

## 2020-07-17 ENCOUNTER — Other Ambulatory Visit: Payer: Self-pay

## 2020-07-17 ENCOUNTER — Ambulatory Visit (INDEPENDENT_AMBULATORY_CARE_PROVIDER_SITE_OTHER): Payer: Medicare PPO | Admitting: Family Medicine

## 2020-07-17 ENCOUNTER — Encounter: Payer: Self-pay | Admitting: Family Medicine

## 2020-07-17 VITALS — BP 140/86 | HR 73 | Temp 98.5°F | Ht 68.0 in | Wt 199.2 lb

## 2020-07-17 DIAGNOSIS — J309 Allergic rhinitis, unspecified: Secondary | ICD-10-CM | POA: Diagnosis not present

## 2020-07-17 DIAGNOSIS — Z86010 Personal history of colonic polyps: Secondary | ICD-10-CM | POA: Diagnosis not present

## 2020-07-17 DIAGNOSIS — E785 Hyperlipidemia, unspecified: Secondary | ICD-10-CM | POA: Diagnosis not present

## 2020-07-17 DIAGNOSIS — N529 Male erectile dysfunction, unspecified: Secondary | ICD-10-CM | POA: Diagnosis not present

## 2020-07-17 DIAGNOSIS — I10 Essential (primary) hypertension: Secondary | ICD-10-CM

## 2020-07-17 DIAGNOSIS — E7439 Other disorders of intestinal carbohydrate absorption: Secondary | ICD-10-CM | POA: Diagnosis not present

## 2020-07-17 LAB — POCT GLYCOSYLATED HEMOGLOBIN (HGB A1C): Hemoglobin A1C: 5.6 % (ref 4.0–5.6)

## 2020-07-17 MED ORDER — LOSARTAN POTASSIUM 50 MG PO TABS: 50.0000 mg | ORAL_TABLET | Freq: Every day | ORAL | 3 refills | Status: DC

## 2020-07-17 MED ORDER — ATORVASTATIN CALCIUM 40 MG PO TABS: 40.0000 mg | ORAL_TABLET | Freq: Every day | ORAL | 3 refills | Status: DC

## 2020-07-17 NOTE — Progress Notes (Signed)
Antonio Harvey is a 65 y.o. male who presents for annual wellness visit and follow-up on chronic medical conditions.  He has underlying seasonal allergies and seems to be doing well on his present medication regimen that includes using fluticasone.Marland Kitchen  He does have underlying ADD and is using Cialis from the [place] and getting good results with that.  He does use trazodone on an as-needed basis for his sleep disturbance.  He takes losartan for his blood pressure.  Does have a history of glucose intolerance but has made some diet and exercise changes and has lost a few pounds because of that.  He continues on atorvastatin and having no difficulty with that.   Immunizations and Health Maintenance Immunization History  Administered Date(s) Administered  . Fluad Quad(high Dose 65+) 12/26/2019  . Influenza Split 01/16/2014, 12/21/2015  . Influenza,inj,Quad PF,6+ Mos 03/19/2013, 04/29/2015, 03/13/2017  . Influenza,inj,quad, With Preservative 06/11/2018  . Influenza-Unspecified 01/16/2010, 01/17/2012, 01/27/2016, 12/18/2019  . PFIZER(Purple Top)SARS-COV-2 Vaccination 05/24/2019, 06/14/2019, 02/13/2020  . Pneumococcal Polysaccharide-23 05/05/2020  . Tdap 03/19/2013, 11/20/2014  . Zoster 04/29/2015  . Zoster Recombinat (Shingrix) 06/25/2018, 08/27/2018   There are no preventive care reminders to display for this patient.  Last colonoscopy: 12/12/11 Last PSA: 03/19/13 Dentist: Q six months  Ophtho: Q year Exercise: walking and weight lifting 4 days a week  Other doctors caring for patient include: [place] doctors  Advanced Directives: Does Patient Have a Medical Advance Directive?: Yes Type of Advance Directive: Living will Does patient want to make changes to medical advance directive?: Yes (Inpatient - patient defers changing a medical advance directive and declines information at this time)  Depression screen:  See questionnaire below.     Depression screen [facility] 2/9 07/17/2020 12/26/2019  Decreased Interest  0 1  Down, Depressed, Hopeless 0 0  PHQ - 2 Score 0 1    Fall Screen: See Questionaire below.   Fall Risk  07/17/2020  Falls in the past year? 0  Number falls in past yr: 0  Injury with Fall? 0  Risk for fall due to : No Fall Risks  Follow up Falls evaluation completed    ADL screen:  See questionnaire below.  Functional Status Survey: Is the patient deaf or have difficulty hearing?: No Does the patient have difficulty seeing, even when wearing glasses/contacts?: No Does the patient have difficulty concentrating, remembering, or making decisions?: No Does the patient have difficulty walking or climbing stairs?: No Does the patient have difficulty dressing or bathing?: No Does the patient have difficulty doing errands alone such as visiting a doctor's office or shopping?: No   Review of Systems  Constitutional: -, -unexpected weight change, -anorexia, -fatigue Allergy: -sneezing, -itching, -congestion Dermatology: denies changing moles, rash, lumps ENT: -runny nose, -ear pain, -sore throat,  Cardiology:  -chest pain, -palpitations, -orthopnea, Respiratory: -cough, -shortness of breath, -dyspnea on exertion, -wheezing,  Gastroenterology: -abdominal pain, -nausea, -vomiting, -diarrhea, -constipation, -dysphagia Hematology: -bleeding or bruising problems Musculoskeletal: -arthralgias, -myalgias, -joint swelling, -back pain, - Ophthalmology: -vision changes,  Urology: -dysuria, -difficulty urinating,  -urinary frequency, -urgency, incontinence Neurology: -, -numbness, , -memory loss, -falls, -dizziness    PHYSICAL EXAM:   General Appearance: Alert, cooperative, no distress, appears stated age Head: Normocephalic, without obvious abnormality, atraumatic Eyes: PERRL, conjunctiva/corneas clear, EOM's intact, Ears: Normal TM's and external ear canals Nose: Nares normal, mucosa normal, no drainage or sinus   tenderness Throat: Lips, mucosa, and tongue normal; teeth and gums  normal Neck: Supple, no lymphadenopathy, thyroid:no enlargement/tenderness/nodules; no  carotid bruit or JVD Lungs: Clear to auscultation bilaterally without wheezes, rales or ronchi; respirations unlabored Heart: Regular rate and rhythm, S1 and S2 normal, no murmur, rub or gallop Skin: Skin color, texture, turgor normal, no rashes or lesions Lymph nodes: Cervical, supraclavicular, and axillary nodes normal Neurologic: CNII-XII intact, normal strength, sensation and gait; reflexes 2+ and symmetric throughout   Psych: Normal mood, affect, hygiene and grooming Hemoglobin A1c is 5.6 ASSESSMENT/PLAN: Essential hypertension - Plan: losartan (COZAAR) 50 MG tablet, Comprehensive metabolic panel, CBC with Differential/Platelet  Hyperlipidemia, unspecified hyperlipidemia type - Plan: atorvastatin (LIPITOR) 40 MG tablet, Lipid panel, Comprehensive metabolic panel  History of colonic polyps  Allergic rhinitis, unspecified seasonality, unspecified trigger  Glucose intolerance - Plan: POCT glycosylated hemoglobin (Hb A1C)  Erectile dysfunction, unspecified erectile dysfunction type Continue on present medication regimen.  He will call when he needs refills if he does not get them from the [place].  Continue to treat the allergies as needed.  He has made significant changes in his diet exercises now doing much better with his hemoglobin A1c.  Discussed  at least 30 minutes of aerobic activity at least 5 days/week;  healthy diet and alcohol recommendations (less than or equal to 2 drinks/day) reviewed; regular seatbelt use; changing batteries in smoke detectors. Immunization recommendations discussed.  Colonoscopy recommendations reviewed.   Medicare Attestation I have personally reviewed: The patient's medical and social history Their use of alcohol, tobacco or illicit drugs Their current medications and supplements The patient's functional ability including ADLs,fall risks, home safety risks, cognitive,  and hearing and visual impairment Diet and physical activities Evidence for depression or mood disorders  The patient's weight, height, and BMI have been recorded in the chart.  I have made referrals, counseling, and provided education to the patient based on review of the above and I have provided the patient with a written personalized care plan for preventive services.     Jill Alexanders, MD   07/17/2020

## 2020-07-17 NOTE — Patient Instructions (Signed)
  Antonio Harvey , Thank you for taking time to come for your Medicare Wellness Visit. I appreciate your ongoing commitment to your health goals. Please review the following plan we discussed and let me know if I can assist you in the future.   These are the goals we discussed: Continue to take good care of yourself. This is a list of the screening recommended for you and due dates:  Health Maintenance  Topic Date Due  . HIV Screening  12/25/2020*  . Flu Shot  11/16/2020  . Pneumonia vaccines (2 of 2 - PCV13) 05/05/2021  . Colon Cancer Screening  12/11/2021  . Tetanus Vaccine  11/19/2024  . COVID-19 Vaccine  Completed  .  Hepatitis C: One time screening is recommended by Center for Disease Control  (CDC) for  adults born from [birth year] through 1966.   Completed  . HPV Vaccine  Aged Out  *Topic was postponed. The date shown is not the original due date.

## 2020-07-18 LAB — CBC WITH DIFFERENTIAL/PLATELET
Basophils Absolute: 0 10*3/uL (ref 0.0–0.2)
Basos: 0 %
EOS (ABSOLUTE): 0 10*3/uL (ref 0.0–0.4)
Eos: 0 %
Hematocrit: 42.8 % (ref 37.5–51.0)
Hemoglobin: 13.8 g/dL (ref 13.0–17.7)
Immature Grans (Abs): 0 10*3/uL (ref 0.0–0.1)
Immature Granulocytes: 0 %
Lymphocytes Absolute: 1.5 10*3/uL (ref 0.7–3.1)
Lymphs: 25 %
MCH: 23.4 pg — ABNORMAL LOW (ref 26.6–33.0)
MCHC: 32.2 g/dL (ref 31.5–35.7)
MCV: 73 fL — ABNORMAL LOW (ref 79–97)
Monocytes Absolute: 0.4 10*3/uL (ref 0.1–0.9)
Monocytes: 8 %
Neutrophils Absolute: 3.9 10*3/uL (ref 1.4–7.0)
Neutrophils: 67 %
Platelets: 173 10*3/uL (ref 150–450)
RBC: 5.9 x10E6/uL — ABNORMAL HIGH (ref 4.14–5.80)
RDW: 16.3 % — ABNORMAL HIGH (ref 11.6–15.4)
WBC: 5.9 10*3/uL (ref 3.4–10.8)

## 2020-07-18 LAB — LIPID PANEL
Chol/HDL Ratio: 3.1 ratio (ref 0.0–5.0)
Cholesterol, Total: 146 mg/dL (ref 100–199)
HDL: 47 mg/dL (ref >39)
LDL Chol Calc (NIH): 88 mg/dL (ref 0–99)
Triglycerides: 54 mg/dL (ref 0–149)
VLDL Cholesterol Cal: 11 mg/dL (ref 5–40)

## 2020-07-18 LAB — COMPREHENSIVE METABOLIC PANEL
ALT: 17 IU/L (ref 0–44)
AST: 20 IU/L (ref 0–40)
Albumin/Globulin Ratio: 1.6 (ref 1.2–2.2)
Albumin: 4.5 g/dL (ref 3.8–4.8)
Alkaline Phosphatase: 101 IU/L (ref 44–121)
BUN/Creatinine Ratio: 10 (ref 10–24)
BUN: 13 mg/dL (ref 8–27)
Bilirubin Total: 0.5 mg/dL (ref 0.0–1.2)
CO2: 23 mmol/L (ref 20–29)
Calcium: 9.2 mg/dL (ref 8.6–10.2)
Chloride: 104 mmol/L (ref 96–106)
Creatinine, Ser: 1.3 mg/dL — ABNORMAL HIGH (ref 0.76–1.27)
Globulin, Total: 2.8 g/dL (ref 1.5–4.5)
Glucose: 90 mg/dL (ref 65–99)
Potassium: 4.2 mmol/L (ref 3.5–5.2)
Sodium: 142 mmol/L (ref 134–144)
Total Protein: 7.3 g/dL (ref 6.0–8.5)
eGFR: 61 mL/min/{1.73_m2} (ref >59)

## 2020-09-07 ENCOUNTER — Telehealth: Payer: Self-pay | Admitting: Family Medicine

## 2020-09-07 NOTE — Telephone Encounter (Signed)
He can certainly try that and see if it will help

## 2020-09-07 NOTE — Telephone Encounter (Signed)
Pt called and said he is still having pain and tightness in his back and wants to know if he should start seeing a chiropractor or if you had any other recommendations?

## 2020-09-08 NOTE — Telephone Encounter (Signed)
I called pt. Antonio Harvey with ok to try chiropractor.

## 2020-09-19 ENCOUNTER — Other Ambulatory Visit: Payer: Self-pay | Admitting: Family Medicine

## 2020-09-19 DIAGNOSIS — E785 Hyperlipidemia, unspecified: Secondary | ICD-10-CM

## 2020-09-22 DIAGNOSIS — M9903 Segmental and somatic dysfunction of lumbar region: Secondary | ICD-10-CM | POA: Diagnosis not present

## 2020-09-22 DIAGNOSIS — M5137 Other intervertebral disc degeneration, lumbosacral region: Secondary | ICD-10-CM | POA: Diagnosis not present

## 2020-09-22 DIAGNOSIS — M9905 Segmental and somatic dysfunction of pelvic region: Secondary | ICD-10-CM | POA: Diagnosis not present

## 2020-09-22 DIAGNOSIS — M25552 Pain in left hip: Secondary | ICD-10-CM | POA: Diagnosis not present

## 2020-09-24 DIAGNOSIS — M25552 Pain in left hip: Secondary | ICD-10-CM | POA: Diagnosis not present

## 2020-09-24 DIAGNOSIS — M9903 Segmental and somatic dysfunction of lumbar region: Secondary | ICD-10-CM | POA: Diagnosis not present

## 2020-09-24 DIAGNOSIS — M5137 Other intervertebral disc degeneration, lumbosacral region: Secondary | ICD-10-CM | POA: Diagnosis not present

## 2020-09-24 DIAGNOSIS — M9905 Segmental and somatic dysfunction of pelvic region: Secondary | ICD-10-CM | POA: Diagnosis not present

## 2020-09-29 DIAGNOSIS — M9905 Segmental and somatic dysfunction of pelvic region: Secondary | ICD-10-CM | POA: Diagnosis not present

## 2020-09-29 DIAGNOSIS — M25552 Pain in left hip: Secondary | ICD-10-CM | POA: Diagnosis not present

## 2020-09-29 DIAGNOSIS — M5137 Other intervertebral disc degeneration, lumbosacral region: Secondary | ICD-10-CM | POA: Diagnosis not present

## 2020-09-29 DIAGNOSIS — M9903 Segmental and somatic dysfunction of lumbar region: Secondary | ICD-10-CM | POA: Diagnosis not present

## 2020-10-01 DIAGNOSIS — M9903 Segmental and somatic dysfunction of lumbar region: Secondary | ICD-10-CM | POA: Diagnosis not present

## 2020-10-01 DIAGNOSIS — M5137 Other intervertebral disc degeneration, lumbosacral region: Secondary | ICD-10-CM | POA: Diagnosis not present

## 2020-10-01 DIAGNOSIS — M25552 Pain in left hip: Secondary | ICD-10-CM | POA: Diagnosis not present

## 2020-10-01 DIAGNOSIS — M9905 Segmental and somatic dysfunction of pelvic region: Secondary | ICD-10-CM | POA: Diagnosis not present

## 2020-10-06 DIAGNOSIS — M5137 Other intervertebral disc degeneration, lumbosacral region: Secondary | ICD-10-CM | POA: Diagnosis not present

## 2020-10-06 DIAGNOSIS — M9905 Segmental and somatic dysfunction of pelvic region: Secondary | ICD-10-CM | POA: Diagnosis not present

## 2020-10-06 DIAGNOSIS — M25552 Pain in left hip: Secondary | ICD-10-CM | POA: Diagnosis not present

## 2020-10-06 DIAGNOSIS — M9903 Segmental and somatic dysfunction of lumbar region: Secondary | ICD-10-CM | POA: Diagnosis not present

## 2020-10-12 DIAGNOSIS — M5137 Other intervertebral disc degeneration, lumbosacral region: Secondary | ICD-10-CM | POA: Diagnosis not present

## 2020-10-12 DIAGNOSIS — M9903 Segmental and somatic dysfunction of lumbar region: Secondary | ICD-10-CM | POA: Diagnosis not present

## 2020-10-12 DIAGNOSIS — M9905 Segmental and somatic dysfunction of pelvic region: Secondary | ICD-10-CM | POA: Diagnosis not present

## 2020-10-12 DIAGNOSIS — M25552 Pain in left hip: Secondary | ICD-10-CM | POA: Diagnosis not present

## 2020-10-13 DIAGNOSIS — M5137 Other intervertebral disc degeneration, lumbosacral region: Secondary | ICD-10-CM | POA: Diagnosis not present

## 2020-10-13 DIAGNOSIS — M9905 Segmental and somatic dysfunction of pelvic region: Secondary | ICD-10-CM | POA: Diagnosis not present

## 2020-10-13 DIAGNOSIS — M25552 Pain in left hip: Secondary | ICD-10-CM | POA: Diagnosis not present

## 2020-10-13 DIAGNOSIS — M9903 Segmental and somatic dysfunction of lumbar region: Secondary | ICD-10-CM | POA: Diagnosis not present

## 2020-10-15 DIAGNOSIS — M5137 Other intervertebral disc degeneration, lumbosacral region: Secondary | ICD-10-CM | POA: Diagnosis not present

## 2020-10-15 DIAGNOSIS — M9905 Segmental and somatic dysfunction of pelvic region: Secondary | ICD-10-CM | POA: Diagnosis not present

## 2020-10-15 DIAGNOSIS — M25552 Pain in left hip: Secondary | ICD-10-CM | POA: Diagnosis not present

## 2020-10-15 DIAGNOSIS — M9903 Segmental and somatic dysfunction of lumbar region: Secondary | ICD-10-CM | POA: Diagnosis not present

## 2020-10-20 DIAGNOSIS — M9903 Segmental and somatic dysfunction of lumbar region: Secondary | ICD-10-CM | POA: Diagnosis not present

## 2020-10-20 DIAGNOSIS — M5137 Other intervertebral disc degeneration, lumbosacral region: Secondary | ICD-10-CM | POA: Diagnosis not present

## 2020-10-20 DIAGNOSIS — M9905 Segmental and somatic dysfunction of pelvic region: Secondary | ICD-10-CM | POA: Diagnosis not present

## 2020-10-20 DIAGNOSIS — M25552 Pain in left hip: Secondary | ICD-10-CM | POA: Diagnosis not present

## 2020-10-27 DIAGNOSIS — M9903 Segmental and somatic dysfunction of lumbar region: Secondary | ICD-10-CM | POA: Diagnosis not present

## 2020-10-27 DIAGNOSIS — M9905 Segmental and somatic dysfunction of pelvic region: Secondary | ICD-10-CM | POA: Diagnosis not present

## 2020-10-27 DIAGNOSIS — M25552 Pain in left hip: Secondary | ICD-10-CM | POA: Diagnosis not present

## 2020-10-27 DIAGNOSIS — M5137 Other intervertebral disc degeneration, lumbosacral region: Secondary | ICD-10-CM | POA: Diagnosis not present

## 2020-11-03 DIAGNOSIS — M25552 Pain in left hip: Secondary | ICD-10-CM | POA: Diagnosis not present

## 2020-11-03 DIAGNOSIS — M9905 Segmental and somatic dysfunction of pelvic region: Secondary | ICD-10-CM | POA: Diagnosis not present

## 2020-11-03 DIAGNOSIS — M5137 Other intervertebral disc degeneration, lumbosacral region: Secondary | ICD-10-CM | POA: Diagnosis not present

## 2020-11-03 DIAGNOSIS — M9903 Segmental and somatic dysfunction of lumbar region: Secondary | ICD-10-CM | POA: Diagnosis not present

## 2020-11-05 DIAGNOSIS — M9903 Segmental and somatic dysfunction of lumbar region: Secondary | ICD-10-CM | POA: Diagnosis not present

## 2020-11-05 DIAGNOSIS — M25552 Pain in left hip: Secondary | ICD-10-CM | POA: Diagnosis not present

## 2020-11-05 DIAGNOSIS — M5137 Other intervertebral disc degeneration, lumbosacral region: Secondary | ICD-10-CM | POA: Diagnosis not present

## 2020-11-05 DIAGNOSIS — M9905 Segmental and somatic dysfunction of pelvic region: Secondary | ICD-10-CM | POA: Diagnosis not present

## 2020-11-10 DIAGNOSIS — M9903 Segmental and somatic dysfunction of lumbar region: Secondary | ICD-10-CM | POA: Diagnosis not present

## 2020-11-10 DIAGNOSIS — M9905 Segmental and somatic dysfunction of pelvic region: Secondary | ICD-10-CM | POA: Diagnosis not present

## 2020-11-10 DIAGNOSIS — M25552 Pain in left hip: Secondary | ICD-10-CM | POA: Diagnosis not present

## 2020-11-10 DIAGNOSIS — M5137 Other intervertebral disc degeneration, lumbosacral region: Secondary | ICD-10-CM | POA: Diagnosis not present

## 2020-11-17 DIAGNOSIS — M5137 Other intervertebral disc degeneration, lumbosacral region: Secondary | ICD-10-CM | POA: Diagnosis not present

## 2020-11-17 DIAGNOSIS — M25552 Pain in left hip: Secondary | ICD-10-CM | POA: Diagnosis not present

## 2020-11-17 DIAGNOSIS — M9905 Segmental and somatic dysfunction of pelvic region: Secondary | ICD-10-CM | POA: Diagnosis not present

## 2020-11-17 DIAGNOSIS — M9903 Segmental and somatic dysfunction of lumbar region: Secondary | ICD-10-CM | POA: Diagnosis not present

## 2020-11-24 DIAGNOSIS — M9905 Segmental and somatic dysfunction of pelvic region: Secondary | ICD-10-CM | POA: Diagnosis not present

## 2020-11-24 DIAGNOSIS — M25552 Pain in left hip: Secondary | ICD-10-CM | POA: Diagnosis not present

## 2020-11-24 DIAGNOSIS — M5137 Other intervertebral disc degeneration, lumbosacral region: Secondary | ICD-10-CM | POA: Diagnosis not present

## 2020-11-24 DIAGNOSIS — M9903 Segmental and somatic dysfunction of lumbar region: Secondary | ICD-10-CM | POA: Diagnosis not present

## 2020-11-25 ENCOUNTER — Other Ambulatory Visit: Payer: Self-pay | Admitting: Family Medicine

## 2020-11-25 DIAGNOSIS — I10 Essential (primary) hypertension: Secondary | ICD-10-CM

## 2020-11-30 DIAGNOSIS — M9903 Segmental and somatic dysfunction of lumbar region: Secondary | ICD-10-CM | POA: Diagnosis not present

## 2020-11-30 DIAGNOSIS — M5137 Other intervertebral disc degeneration, lumbosacral region: Secondary | ICD-10-CM | POA: Diagnosis not present

## 2020-11-30 DIAGNOSIS — M25552 Pain in left hip: Secondary | ICD-10-CM | POA: Diagnosis not present

## 2020-11-30 DIAGNOSIS — M9905 Segmental and somatic dysfunction of pelvic region: Secondary | ICD-10-CM | POA: Diagnosis not present

## 2020-12-07 DIAGNOSIS — M9903 Segmental and somatic dysfunction of lumbar region: Secondary | ICD-10-CM | POA: Diagnosis not present

## 2020-12-07 DIAGNOSIS — M25552 Pain in left hip: Secondary | ICD-10-CM | POA: Diagnosis not present

## 2020-12-07 DIAGNOSIS — M5137 Other intervertebral disc degeneration, lumbosacral region: Secondary | ICD-10-CM | POA: Diagnosis not present

## 2020-12-07 DIAGNOSIS — M9905 Segmental and somatic dysfunction of pelvic region: Secondary | ICD-10-CM | POA: Diagnosis not present

## 2020-12-26 ENCOUNTER — Other Ambulatory Visit: Payer: Self-pay | Admitting: Family Medicine

## 2020-12-26 DIAGNOSIS — E785 Hyperlipidemia, unspecified: Secondary | ICD-10-CM

## 2021-01-18 ENCOUNTER — Encounter: Payer: Medicare PPO | Admitting: Family Medicine

## 2021-01-21 DIAGNOSIS — H11122 Conjunctival concretions, left eye: Secondary | ICD-10-CM | POA: Diagnosis not present

## 2021-01-21 DIAGNOSIS — H04123 Dry eye syndrome of bilateral lacrimal glands: Secondary | ICD-10-CM | POA: Diagnosis not present

## 2021-02-01 ENCOUNTER — Encounter: Payer: Self-pay | Admitting: Family Medicine

## 2021-02-01 ENCOUNTER — Ambulatory Visit (INDEPENDENT_AMBULATORY_CARE_PROVIDER_SITE_OTHER): Payer: Medicare PPO | Admitting: Family Medicine

## 2021-02-01 ENCOUNTER — Other Ambulatory Visit: Payer: Self-pay

## 2021-02-01 VITALS — BP 138/79 | HR 68

## 2021-02-01 DIAGNOSIS — I10 Essential (primary) hypertension: Secondary | ICD-10-CM

## 2021-02-01 DIAGNOSIS — J309 Allergic rhinitis, unspecified: Secondary | ICD-10-CM | POA: Diagnosis not present

## 2021-02-01 DIAGNOSIS — Z23 Encounter for immunization: Secondary | ICD-10-CM

## 2021-02-01 DIAGNOSIS — E785 Hyperlipidemia, unspecified: Secondary | ICD-10-CM | POA: Diagnosis not present

## 2021-02-01 NOTE — Progress Notes (Signed)
   Subjective:    Patient ID: Antonio Harvey, male    DOB: 1955-11-06, 65 y.o.   MRN: [id number]  HPI He is here for an interval evaluation.  He plans to go to the [place] to get his thumb taken care of and also has a skin tag in his left axilla that he would like removed.  He is starting to exercise more.  He is considering retiring in the near future.  He would like a flu shot today. He continues on atorvastatin as well as lovastatin and also taking Flonase for his allergies.  He is also to be fairly stable.  Review of Systems     Objective:   Physical Exam Alert and in no distress otherwise not examined  Also of note is the fact that the [place] has now connected with epic and that should increase continuity of care.     Assessment & Plan:  Needs flu shot - Plan: Flu Vaccine QUAD High Dose(Fluad)  Hyperlipidemia, unspecified hyperlipidemia type  Essential hypertension  Allergic rhinitis, unspecified seasonality, unspecified trigger

## 2021-04-30 HISTORY — PX: CYST REMOVAL HAND: SHX6279

## 2021-07-21 ENCOUNTER — Ambulatory Visit: Payer: Medicare PPO | Admitting: Family Medicine

## 2021-08-13 ENCOUNTER — Ambulatory Visit (INDEPENDENT_AMBULATORY_CARE_PROVIDER_SITE_OTHER): Payer: Medicare PPO

## 2021-08-13 VITALS — BP 132/72 | HR 58 | Temp 98.3°F | Ht 69.5 in | Wt 200.6 lb

## 2021-08-13 DIAGNOSIS — Z0000 Encounter for general adult medical examination without abnormal findings: Secondary | ICD-10-CM

## 2021-08-13 NOTE — Progress Notes (Signed)
?This visit occurred during the SARS-CoV-2 public health emergency.  Safety protocols were in place, including screening questions prior to the visit, additional usage of staff PPE, and extensive cleaning of exam room while observing appropriate contact time as indicated for disinfecting solutions. ? ?Subjective:  ? Antonio Harvey is a 66 y.o. male who presents for an Initial Medicare Annual Wellness Visit. ? ?Review of Systems    ? ?Cardiac Risk Factors include: advanced age (>4mn, >>7women);dyslipidemia;hypertension;male gender ? ?   ?Objective:  ?  ?Today's Vitals  ? 08/13/21 0838 08/13/21 0845  ?BP: 132/72   ?Pulse: (!) 58   ?Temp: 98.3 ?F (36.8 ?C)   ?TempSrc: Oral   ?SpO2: 98%   ?Weight: 200 lb 9.6 oz (91 kg)   ?Height: 5' 9.5" (1.765 m)   ?PainSc:  5   ? ?Body mass index is 29.2 kg/m?. ? ? ?  08/13/2021  ?  8:52 AM 07/17/2020  ?  1:45 PM 09/13/2019  ?  9:20 PM 05/27/2015  ? 10:07 AM 05/21/2015  ?  1:44 PM 06/28/2013  ?  1:00 AM  ?Advanced Directives  ?Does Patient Have a Medical Advance Directive? No Yes No No No Patient does not have advance directive  ?Type of Advance Directive  Living will      ?Does patient want to make changes to medical advance directive?  Yes (Inpatient - patient defers changing a medical advance directive and declines information at this time)      ?Would patient like information on creating a medical advance directive? No - Patient declined   Yes - EScientist, clinical (histocompatibility and immunogenetics)given    ?Pre-existing out of facility DNR order (yellow form or pink MOST form)      No  ? ? ?Current Medications (verified) ?Outpatient Encounter Medications as of 08/13/2021  ?Medication Sig  ? acetaminophen (TYLENOL) 500 MG tablet Take 500 mg by mouth every 6 (six) hours as needed.  ? atorvastatin (LIPITOR) 40 MG tablet TAKE 1 TABLET(40 MG) BY MOUTH DAILY  ? fluticasone (FLONASE) 50 MCG/ACT nasal spray fluticasone propionate 50 mcg/actuation nasal spray,suspension  ? losartan (COZAAR) 50 MG tablet TAKE 1 TABLET(50 MG) BY  MOUTH DAILY  ? tadalafil (CIALIS) 20 MG tablet Take 1 tablet (20 mg total) by mouth daily as needed for erectile dysfunction.  ? TRAZODONE HCL PO Take by mouth.  ? ?No facility-administered encounter medications on file as of 08/13/2021.  ? ? ?Allergies (verified) ?Sildenafil  ? ?History: ?Past Medical History:  ?Diagnosis Date  ? Allergy   ? Chronic LBP   ? Diverticulosis   ? History of colonic polyps   ? Hyperlipidemia   ? Sleep apnea   ? ?Past Surgical History:  ?Procedure Laterality Date  ? CYST REMOVAL HAND Right 04/30/2021  ? GANGLION CYST EXCISION Left 05/27/2015  ? Procedure: REMOVAL GANGLION CYST OF LEFT 3RD FINGER;  Surgeon: NLeandrew Koyanagi MD;  [place] [facility]  Service: [place]opedics;  Laterality: Left;  ? INCISION AND DRAINAGE PERIRECTAL ABSCESS N/A 06/28/2013  ? Procedure: IRRIGATION AND DEBRIDEMENT PERIRECTAL ABSCESS;  Surgeon: DHarl Bowie MD;  [place]  Service: General;  Laterality: N/A;  ? [place] ? ?Family History  ?Problem Relation Age of Onset  ? Asthma Mother   ? Hypertension Mother   ? Stroke Father   ?     died of CVA  ? Kidney disease Father   ?     dialysis  ? Diabetes  Father   ? Arthritis Sister   ? Colon cancer Neg Hx   ? ?Social History  ? ?Socioeconomic History  ? Marital status: Married  ?  Spouse name: Not on file  ? Number of children: Not on file  ? Years of education: Not on file  ? Highest education level: Not on file  ?Occupational History  ? Not on file  ?Tobacco Use  ? Smoking status: Never  ? Smokeless tobacco: Never  ?Vaping Use  ? Vaping Use: Never used  ?Substance and Sexual Activity  ? Alcohol use: No  ? Drug use: No  ? Sexual activity: Yes  ?Other Topics Concern  ? Not on file  ?Social History Narrative  ? Not on file  ? ?Social Determinants of Health  ? ?Financial Resource Strain: Low Risk   ? Difficulty of Paying Living Expenses: Not hard at all  ?Food Insecurity: No Food Insecurity  ? Worried About Paediatric nurse in the Last Year: Never true  ? Ran Out of Food in the Last Year: Never true  ?Transportation Needs: No Transportation Needs  ? Lack of Transportation (Medical): No  ? Lack of Transportation (Non-Medical): No  ?Physical Activity: Inactive  ? Days of Exercise per Week: 0 days  ? Minutes of Exercise per Session: 0 min  ?Stress: No Stress Concern Present  ? Feeling of Stress : Only a little  ?Social Connections: Not on file  ? ? ?Tobacco Counseling ?Counseling given: Not Answered ? ? ?Clinical Intake: ? ?Pre-visit preparation completed: Yes ? ?Pain : 0-10 ?Pain Score: 5  ?Pain Type: Chronic pain ?Pain Location: Knee ?Pain Orientation: Right ?Pain Descriptors / Indicators: Aching ?Pain Onset: More than a month ago ?Pain Frequency: Intermittent ?Pain Relieving Factors: APAP and cream helps some ? ?Pain Relieving Factors: APAP and cream helps some ? ?Nutritional Status: BMI 25 -29 Overweight ?Nutritional Risks: None ?Diabetes: No ? ?How often do you need to have someone help you when you read instructions, pamphlets, or other written materials from your doctor or pharmacy?: 1 - Never ?What is the last grade level you completed in school?: Bachelor's degree ? ?Diabetic? no ? ?Interpreter Needed?: No ? ?Information entered by :: NAllen LPN ? ? ?Activities of Daily Living ? ?  08/13/2021  ?  8:57 AM  ?In your present state of health, do you have any difficulty performing the following activities:  ?Hearing? 0  ?Vision? 0  ?Comment small print  ?Difficulty concentrating or making decisions? 0  ?Walking or climbing stairs? 1  ?Dressing or bathing? 0  ?Doing errands, shopping? 0  ?Preparing Food and eating ? N  ?Using the Toilet? N  ?In the past six months, have you accidently leaked urine? N  ?Do you have problems with loss of bowel control? N  ?Managing your Medications? N  ?Managing your Finances? N  ?Housekeeping or managing your Housekeeping? N  ? ? ?Patient Care Team: ?Denita Lung, MD as PCP - General  (Family Medicine) ? ?Indicate any recent Medical Services you may have received from other than [facility] providers in the past year (date may be approximate). ? ?   ?Assessment:  ? This is a routine wellness examination for [place]. ? ?Hearing/Vision screen ?Vision Screening - Comments:: [facility] ? ?Dietary issues and exercise activities discussed: ?Current Exercise Habits: The patient does not participate in regular exercise at present ? ? Goals Addressed   ? ?  ?  ?  ?  ?  This Visit's Progress  ?  Patient Stated     ?  08/13/2021, start exercising when knee gets better ?  ? ?  ? ?Depression Screen ? ?  08/13/2021  ?  8:54 AM 07/17/2020  ?  1:47 PM 12/26/2019  ? 11:11 AM  ?PHQ 2/9 Scores  ?PHQ - 2 Score 0 0 1  ?PHQ- 9 Score 3    ?  ?Fall Risk ? ?  08/13/2021  ?  8:54 AM 07/17/2020  ?  1:46 PM  ?Fall Risk   ?Falls in the past year? 1 0  ?Comment tripped   ?Number falls in past yr: 1 0  ?Injury with Fall? 0 0  ?Risk for fall due to : Medication side effect No Fall Risks  ?Follow up Falls evaluation completed;Education provided;Falls prevention discussed Falls evaluation completed  ? ? ?FALL RISK PREVENTION PERTAINING TO THE HOME: ? ?Any stairs in or around the home? Yes  ?If so, are there any without handrails? No  ?Home free of loose throw rugs in walkways, pet beds, electrical cords, etc? Yes  ?Adequate lighting in your home to reduce risk of falls? Yes  ? ?ASSISTIVE DEVICES UTILIZED TO PREVENT FALLS: ? ?Life alert? No  ?Use of a cane, walker or w/c? No  ?Grab bars in the bathroom? No  ?Shower chair or bench in shower? No  ?Elevated toilet seat or a handicapped toilet? Yes  ? ?TIMED UP AND GO: ? ?Was the test performed? No .  ? ? ?Gait slow and steady without use of assistive device ? ?Cognitive Function: ?  ?  ? ?  08/13/2021  ?  9:00 AM  ?6CIT Screen  ?What Year? 0 points  ?What month? 0 points  ?What time? 0 points  ?Count back from 20 0 points  ?Months in reverse 0 points  ?Repeat phrase 0 points   ?Total Score 0 points  ? ? ?Immunizations ?Immunization History  ?Administered Date(s) Administered  ? Fluad Quad(high Dose 65+) 12/26/2019, 02/01/2021  ? Influenza Split 01/16/2014, 12/21/2015  ? Influenza,i

## 2021-08-13 NOTE — Patient Instructions (Signed)
Mr. Carlisi , ?Thank you for taking time to come for your Medicare Wellness Visit. I appreciate your ongoing commitment to your health goals. Please review the following plan we discussed and let me know if I can assist you in the future.  ? ?Screening recommendations/referrals: ?Colonoscopy: completed 12/12/2011, due 12/11/2021 ? ?Recommended yearly ophthalmology/optometry visit for glaucoma screening and checkup ?Recommended yearly dental visit for hygiene and checkup ? ?Vaccinations: ?Influenza vaccine: due 11/16/2021 ?Pneumococcal vaccine: due prevnar 20 ?Tdap vaccine: completed 11/20/2014, due 11/19/2024 ?Shingles vaccine: completed   ?Covid-19:  03/10/2021, 02/13/2020, 06/14/2019, 05/24/2019 ? ?Advanced directives: Advance directive discussed with you today. Even though you declined this today please call our office should you change your mind and we can give you the proper paperwork for you to fill out. ? ?Conditions/risks identified: none ? ?Next appointment: Follow up in one year for your annual wellness visit.  ? ?Preventive Care 13 Years and Older, Male ?Preventive care refers to lifestyle choices and visits with your health care provider that can promote health and wellness. ?What does preventive care include? ?A yearly physical exam. This is also called an annual well check. ?Dental exams once or twice a year. ?Routine eye exams. Ask your health care provider how often you should have your eyes checked. ?Personal lifestyle choices, including: ?Daily care of your teeth and gums. ?Regular physical activity. ?Eating a healthy diet. ?Avoiding tobacco and drug use. ?Limiting alcohol use. ?Practicing safe sex. ?Taking low doses of aspirin every day. ?Taking vitamin and mineral supplements as recommended by your health care provider. ?What happens during an annual well check? ?The services and screenings done by your health care provider during your annual well check will depend on your age, overall health, lifestyle risk  factors, and family history of disease. ?Counseling  ?Your health care provider may ask you questions about your: ?Alcohol use. ?Tobacco use. ?Drug use. ?Emotional well-being. ?Home and relationship well-being. ?Sexual activity. ?Eating habits. ?History of falls. ?Memory and ability to understand (cognition). ?Work and work Statistician. ?Screening  ?You may have the following tests or measurements: ?Height, weight, and BMI. ?Blood pressure. ?Lipid and cholesterol levels. These may be checked every 5 years, or more frequently if you are over 47 years old. ?Skin check. ?Lung cancer screening. You may have this screening every year starting at age 33 if you have a 30-pack-year history of smoking and currently smoke or have quit within the past 15 years. ?Fecal occult blood test (FOBT) of the stool. You may have this test every year starting at age 57. ?Flexible sigmoidoscopy or colonoscopy. You may have a sigmoidoscopy every 5 years or a colonoscopy every 10 years starting at age 71. ?Prostate cancer screening. Recommendations will vary depending on your family history and other risks. ?Hepatitis C blood test. ?Hepatitis B blood test. ?Sexually transmitted disease (STD) testing. ?Diabetes screening. This is done by checking your blood sugar (glucose) after you have not eaten for a while (fasting). You may have this done every 1-3 years. ?Abdominal aortic aneurysm (AAA) screening. You may need this if you are a current or former smoker. ?Osteoporosis. You may be screened starting at age 98 if you are at high risk. ?Talk with your health care provider about your test results, treatment options, and if necessary, the need for more tests. ?Vaccines  ?Your health care provider may recommend certain vaccines, such as: ?Influenza vaccine. This is recommended every year. ?Tetanus, diphtheria, and acellular pertussis (Tdap, Td) vaccine. You may need a Td booster  every 10 years. ?Zoster vaccine. You may need this after age  68. ?Pneumococcal 13-valent conjugate (PCV13) vaccine. One dose is recommended after age 80. ?Pneumococcal polysaccharide (PPSV23) vaccine. One dose is recommended after age 40. ?Talk to your health care provider about which screenings and vaccines you need and how often you need them. ?This information is not intended to replace advice given to you by your health care provider. Make sure you discuss any questions you have with your health care provider. ?Document Released: 05/01/2015 Document Revised: 12/23/2015 Document Reviewed: 02/03/2015 ?Elsevier Interactive Patient Education ? 2017 [place]. ? ?Fall Prevention in the Home ?Falls can cause injuries. They can happen to people of all ages. There are many things you can do to make your home safe and to help prevent falls. ?What can I do on the outside of my home? ?Regularly fix the edges of walkways and driveways and fix any cracks. ?Remove anything that might make you trip as you walk through a door, such as a raised step or threshold. ?Trim any bushes or trees on the path to your home. ?Use bright outdoor lighting. ?Clear any walking paths of anything that might make someone trip, such as rocks or tools. ?Regularly check to see if handrails are loose or broken. Make sure that both sides of any steps have handrails. ?Any raised decks and porches should have guardrails on the edges. ?Have any leaves, snow, or ice cleared regularly. ?Use sand or salt on walking paths during winter. ?Clean up any spills in your garage right away. This includes oil or grease spills. ?What can I do in the bathroom? ?Use night lights. ?Install grab bars by the toilet and in the tub and shower. Do not use towel bars as grab bars. ?Use non-skid mats or decals in the tub or shower. ?If you need to sit down in the shower, use a plastic, non-slip stool. ?Keep the floor dry. Clean up any water that spills on the floor as soon as it happens. ?Remove soap buildup in the tub or shower  regularly. ?Attach bath mats securely with double-sided non-slip rug tape. ?Do not have throw rugs and other things on the floor that can make you trip. ?What can I do in the bedroom? ?Use night lights. ?Make sure that you have a light by your bed that is easy to reach. ?Do not use any sheets or blankets that are too big for your bed. They should not hang down onto the floor. ?Have a firm chair that has side arms. You can use this for support while you get dressed. ?Do not have throw rugs and other things on the floor that can make you trip. ?What can I do in the kitchen? ?Clean up any spills right away. ?Avoid walking on wet floors. ?Keep items that you use a lot in easy-to-reach places. ?If you need to reach something above you, use a strong step stool that has a grab bar. ?Keep electrical cords out of the way. ?Do not use floor polish or wax that makes floors slippery. If you must use wax, use non-skid floor wax. ?Do not have throw rugs and other things on the floor that can make you trip. ?What can I do with my stairs? ?Do not leave any items on the stairs. ?Make sure that there are handrails on both sides of the stairs and use them. Fix handrails that are broken or loose. Make sure that handrails are as long as the stairways. ?Check any carpeting to  make sure that it is firmly attached to the stairs. Fix any carpet that is loose or worn. ?Avoid having throw rugs at the top or bottom of the stairs. If you do have throw rugs, attach them to the floor with carpet tape. ?Make sure that you have a light switch at the top of the stairs and the bottom of the stairs. If you do not have them, ask someone to add them for you. ?What else can I do to help prevent falls? ?Wear shoes that: ?Do not have high heels. ?Have rubber bottoms. ?Are comfortable and fit you well. ?Are closed at the toe. Do not wear sandals. ?If you use a stepladder: ?Make sure that it is fully opened. Do not climb a closed stepladder. ?Make sure that  both sides of the stepladder are locked into place. ?Ask someone to hold it for you, if possible. ?Clearly mark and make sure that you can see: ?Any grab bars or handrails. ?First and last steps. ?Where the e

## 2021-09-03 ENCOUNTER — Other Ambulatory Visit: Payer: Self-pay | Admitting: Family Medicine

## 2021-09-03 DIAGNOSIS — I10 Essential (primary) hypertension: Secondary | ICD-10-CM

## 2021-09-14 ENCOUNTER — Encounter: Payer: Self-pay | Admitting: Family Medicine

## 2021-09-14 ENCOUNTER — Ambulatory Visit (INDEPENDENT_AMBULATORY_CARE_PROVIDER_SITE_OTHER): Payer: Medicare PPO | Admitting: Family Medicine

## 2021-09-14 VITALS — BP 140/72 | HR 63 | Temp 97.3°F | Ht 69.0 in | Wt 198.4 lb

## 2021-09-14 DIAGNOSIS — N529 Male erectile dysfunction, unspecified: Secondary | ICD-10-CM | POA: Diagnosis not present

## 2021-09-14 DIAGNOSIS — Z1211 Encounter for screening for malignant neoplasm of colon: Secondary | ICD-10-CM

## 2021-09-14 DIAGNOSIS — J309 Allergic rhinitis, unspecified: Secondary | ICD-10-CM

## 2021-09-14 DIAGNOSIS — Z86010 Personal history of colonic polyps: Secondary | ICD-10-CM

## 2021-09-14 DIAGNOSIS — E7439 Other disorders of intestinal carbohydrate absorption: Secondary | ICD-10-CM | POA: Diagnosis not present

## 2021-09-14 DIAGNOSIS — R7302 Impaired glucose tolerance (oral): Secondary | ICD-10-CM

## 2021-09-14 DIAGNOSIS — G4730 Sleep apnea, unspecified: Secondary | ICD-10-CM | POA: Diagnosis not present

## 2021-09-14 DIAGNOSIS — Z136 Encounter for screening for cardiovascular disorders: Secondary | ICD-10-CM | POA: Diagnosis not present

## 2021-09-14 DIAGNOSIS — E785 Hyperlipidemia, unspecified: Secondary | ICD-10-CM | POA: Diagnosis not present

## 2021-09-14 DIAGNOSIS — Z8042 Family history of malignant neoplasm of prostate: Secondary | ICD-10-CM

## 2021-09-14 DIAGNOSIS — Z6379 Other stressful life events affecting family and household: Secondary | ICD-10-CM

## 2021-09-14 DIAGNOSIS — I10 Essential (primary) hypertension: Secondary | ICD-10-CM

## 2021-09-14 DIAGNOSIS — Z0000 Encounter for general adult medical examination without abnormal findings: Secondary | ICD-10-CM

## 2021-09-14 LAB — POCT URINALYSIS DIP (PROADVANTAGE DEVICE)
Blood, UA: NEGATIVE
Glucose, UA: NEGATIVE mg/dL
Ketones, POC UA: NEGATIVE mg/dL
Leukocytes, UA: NEGATIVE
Nitrite, UA: NEGATIVE
Protein Ur, POC: NEGATIVE mg/dL
Specific Gravity, Urine: 1.025
Urobilinogen, Ur: 0.2
pH, UA: 6 (ref 5.0–8.0)

## 2021-09-14 LAB — POCT GLYCOSYLATED HEMOGLOBIN (HGB A1C): Hemoglobin A1C: 6 % — AB (ref 4.0–5.6)

## 2021-09-14 NOTE — Progress Notes (Signed)
Complete physical exam  Patient: Antonio Harvey   DOB: 1956/03/05   66 y.o. Male  MRN: [id number]  Subjective:    Antonio Harvey is a 66 y.o. male who presents today for a complete physical exam. He reports consuming a general, low fat, and low sodium diet. The patient does not participate in regular exercise at present. He generally feels fairly well. He reports sleeping fairly well.  He does have OSA and is on CPAP.  He is being monitored by the [place].  He gets at least 4 hours of sleep and does use trazodone.  He also has a previous history of colonic polyps however the most recent colonoscopy in 2013 showed a hyperplastic polyp.  He is now also dealing with the fact that his mother is now 1 and on oxygen and apparently slowly dwindling.  He now has a brother that has prostate cancer.  He would like to get tested.  These issues have him quite stressed at the present time.  His allergies are under good control.  He continues on Cozaar.  Is also taking Lipitor for his hyperlipidemia.  He uses Cialis on an as-needed basis.  He does have a history of glucose intolerance.  Otherwise his family and social history as well as health maintenance and immunizations was reviewed  Most recent fall risk assessment:    08/13/2021    8:54 AM  [place] in the past year? 1  Comment tripped  Number falls in past yr: 1  Injury with Fall? 0  Risk for fall due to : Medication side effect  Follow up Falls evaluation completed;Education provided;Falls prevention discussed     Most recent depression screenings:    08/13/2021    8:54 AM 07/17/2020    1:47 PM  PHQ 2/9 Scores  PHQ - 2 Score 0 0  PHQ- 9 Score 3       Patient Active Problem List   Diagnosis Date Noted   Essential hypertension 08/20/2019   Glucose intolerance 08/20/2019   Erectile dysfunction 08/20/2019   Pain in right knee 11/30/2017   Allergic rhinitis 05/09/2016   History of colonic polyps 07/24/2014   Diverticulosis of colon  without hemorrhage 07/24/2014   Tinnitus 04/26/2013   Hyperlipidemia 01/25/2011   Sleep apnea 01/25/2011   Past Medical History:  Diagnosis Date   Allergy    Chronic LBP    Diverticulosis    History of colonic polyps    Hyperlipidemia    Sleep apnea    Past Surgical History:  Procedure Laterality Date   CYST REMOVAL HAND Right 04/30/2021   GANGLION CYST EXCISION Left 05/27/2015   Procedure: REMOVAL GANGLION CYST OF LEFT 3RD FINGER;  Surgeon: Leandrew Koyanagi, MD;  Location: [facility];  Service: Orthopedics;  Laterality: Left;   INCISION AND DRAINAGE PERIRECTAL ABSCESS N/A 06/28/2013   Procedure: IRRIGATION AND DEBRIDEMENT PERIRECTAL ABSCESS;  Surgeon: Harl Bowie, MD;  Location: [facility];  Service: General;  Laterality: N/A;   TONSILLECTOMY AND ADENOIDECTOMY  1966   Social History   Tobacco Use   Smoking status: Never   Smokeless tobacco: Never  Vaping Use   Vaping Use: Never used  Substance Use Topics   Alcohol use: No   Drug use: No   Family History  Problem Relation Age of Onset   Asthma Mother    Hypertension Mother    Stroke Father        died of CVA  Kidney disease Father        dialysis   Diabetes Father    Arthritis Sister    Colon cancer Neg Hx    Allergies  Allergen Reactions   Sildenafil Palpitations      Patient Care Team: Denita Lung, MD as PCP - General (Family Medicine)   Outpatient Medications Prior to Visit  Medication Sig   acetaminophen (TYLENOL) 500 MG tablet Take 500 mg by mouth every 6 (six) hours as needed.   atorvastatin (LIPITOR) 40 MG tablet TAKE 1 TABLET(40 MG) BY MOUTH DAILY   diclofenac Sodium (VOLTAREN) 1 % GEL APPLY 2 GRAMS TO AFFECTED AREA EVERY 6 HOURS AS NEEDED   fluticasone (FLONASE) 50 MCG/ACT nasal spray fluticasone propionate 50 mcg/actuation nasal spray,suspension   losartan (COZAAR) 50 MG tablet TAKE 1 TABLET(50 MG) BY MOUTH DAILY   tadalafil (CIALIS) 20 MG tablet Take 1 tablet (20 mg total) by  mouth daily as needed for erectile dysfunction.   TRAZODONE HCL PO Take by mouth.   No facility-administered medications prior to visit.    ROS        Objective:     BP 140/72   Pulse 63   Temp (!) 97.3 F (36.3 C)   Ht 5' 9"  (1.753 m)   Wt 198 lb 6.4 oz (90 kg)   SpO2 98%   BMI 29.30 kg/m  BP Readings from Last 3 Encounters:  09/14/21 140/72  08/13/21 132/72  02/01/21 138/79      Physical Exam  Alert and in no distress. Tympanic membranes and canals are normal. Pharyngeal area is normal. Neck is supple without adenopathy or thyromegaly. Cardiac exam shows a regular sinus rhythm without murmurs or gallops. Lungs are clear to auscultation.  Hemoglobin A1c is 6.0 Last CBC Lab Results  Component Value Date   WBC 5.9 07/17/2020   HGB 13.8 07/17/2020   HCT 42.8 07/17/2020   MCV 73 (L) 07/17/2020   MCH 23.4 (L) 07/17/2020   RDW 16.3 (H) 07/17/2020   PLT 173 88/41/6606   Last metabolic panel Lab Results  Component Value Date   GLUCOSE 90 07/17/2020   NA 142 07/17/2020   K 4.2 07/17/2020   CL 104 07/17/2020   CO2 23 07/17/2020   BUN 13 07/17/2020   CREATININE 1.30 (H) 07/17/2020   EGFR 61 07/17/2020   CALCIUM 9.2 07/17/2020   PROT 7.3 07/17/2020   ALBUMIN 4.5 07/17/2020   LABGLOB 2.8 07/17/2020   AGRATIO 1.6 07/17/2020   BILITOT 0.5 07/17/2020   ALKPHOS 101 07/17/2020   AST 20 07/17/2020   ALT 17 07/17/2020   Last lipids Lab Results  Component Value Date   CHOL 146 07/17/2020   HDL 47 07/17/2020   LDLCALC 88 07/17/2020   TRIG 54 07/17/2020   CHOLHDL 3.1 07/17/2020   Last vitamin D No results found for: 25OHVITD2, 25OHVITD3, VD25OH      Assessment & Plan:    Routine Health Maintenance and Physical Exam  Immunization History  Administered Date(s) Administered   Fluad Quad(high Dose 65+) 12/26/2019, 02/01/2021   Influenza Split 01/16/2014, 12/21/2015   Influenza,inj,Quad PF,6+ Mos 03/19/2013, 04/29/2015, 03/13/2017   Influenza,inj,quad, With  Preservative 06/11/2018   Influenza-Unspecified 01/16/2010, 01/17/2012, 01/27/2016, 12/18/2019   PFIZER(Purple Top)SARS-COV-2 Vaccination 05/24/2019, 06/14/2019, 02/13/2020   Pneumococcal Polysaccharide-23 05/05/2020   Tdap 03/19/2013, 11/20/2014   Zoster Recombinat (Shingrix) 06/25/2018, 08/27/2018   Zoster, Live 04/29/2015    Health Maintenance  Topic Date Due   Pneumonia Vaccine 65+ Years  old (2 - PCV) 08/14/2022 (Originally 05/05/2021)   INFLUENZA VACCINE  11/16/2021   COLONOSCOPY (Pts 45-35yr Insurance coverage will need to be confirmed)  12/11/2021   TETANUS/TDAP  11/19/2024   COVID-19 Vaccine  Completed   Hepatitis C Screening  Completed   Zoster Vaccines- Shingrix  Completed   HPV VACCINES  Aged Out   Routine general medical examination at a health care facility - Plan: CBC with Differential/Platelet, Comprehensive metabolic panel, Lipid panel, POCT Urinalysis DIP (Proadvantage Device)  Essential hypertension  Allergic rhinitis, unspecified seasonality, unspecified trigger  Sleep apnea, unspecified type  Erectile dysfunction, unspecified erectile dysfunction type  Glucose intolerance - Plan: POCT glycosylated hemoglobin (Hb A1C)  History of colonic polyps  Hyperlipidemia, unspecified hyperlipidemia type  Family history of prostate cancer - Plan: PSA  Screening for colon cancer - Plan: Cologuard  Stress due to illness of family member  Discussed health benefits of physical activity, and encouraged him to engage in regular exercise appropriate for his age and condition.  Problem List Items Addressed This Visit     Allergic rhinitis   Erectile dysfunction   Essential hypertension   Glucose intolerance   Relevant Orders   POCT glycosylated hemoglobin (Hb A1C) (Completed)   History of colonic polyps   Hyperlipidemia   Sleep apnea   Other Visit Diagnoses     Routine general medical examination at a health care facility    -  Primary   Relevant Orders    CBC with Differential/Platelet   Comprehensive metabolic panel   Lipid panel   POCT Urinalysis DIP (Proadvantage Device) (Completed)   Family history of prostate cancer       Relevant Orders   PSA   Screening for colon cancer       Relevant Orders   Cologuard   Stress due to illness of family member          This is a stress that he is under and strongly encouraged him to get involved in counseling.  [facility]  He will also follow-up concerning his CPAP.  Referral for colonoscopy will done since his last colonoscopy showed a hyperplastic polyp.  [facility] on his present [facility] regimen [facility]  Strongly encouraged him to continue with his diet and exercise since his A1c is 6.0.     JJill Alexanders MD

## 2021-09-15 LAB — LIPID PANEL
Chol/HDL Ratio: 3.5 ratio (ref 0.0–5.0)
Cholesterol, Total: 145 mg/dL (ref 100–199)
HDL: 42 mg/dL (ref >39)
LDL Chol Calc (NIH): 88 mg/dL (ref 0–99)
Triglycerides: 77 mg/dL (ref 0–149)
VLDL Cholesterol Cal: 15 mg/dL (ref 5–40)

## 2021-09-15 LAB — CBC WITH DIFFERENTIAL/PLATELET
Basophils Absolute: 0 10*3/uL (ref 0.0–0.2)
Basos: 1 %
EOS (ABSOLUTE): 0 10*3/uL (ref 0.0–0.4)
Eos: 1 %
Hematocrit: 44.7 % (ref 37.5–51.0)
Hemoglobin: 14.2 g/dL (ref 13.0–17.7)
Immature Grans (Abs): 0 10*3/uL (ref 0.0–0.1)
Immature Granulocytes: 0 %
Lymphocytes Absolute: 2.2 10*3/uL (ref 0.7–3.1)
Lymphs: 36 %
MCH: 23.7 pg — ABNORMAL LOW (ref 26.6–33.0)
MCHC: 31.8 g/dL (ref 31.5–35.7)
MCV: 75 fL — ABNORMAL LOW (ref 79–97)
Monocytes Absolute: 0.5 10*3/uL (ref 0.1–0.9)
Monocytes: 8 %
Neutrophils Absolute: 3.4 10*3/uL (ref 1.4–7.0)
Neutrophils: 54 %
Platelets: 162 10*3/uL (ref 150–450)
RBC: 6 x10E6/uL — ABNORMAL HIGH (ref 4.14–5.80)
RDW: 17.8 % — ABNORMAL HIGH (ref 11.6–15.4)
WBC: 6.1 10*3/uL (ref 3.4–10.8)

## 2021-09-15 LAB — COMPREHENSIVE METABOLIC PANEL
ALT: 25 IU/L (ref 0–44)
AST: 20 IU/L (ref 0–40)
Albumin/Globulin Ratio: 2 (ref 1.2–2.2)
Albumin: 4.7 g/dL (ref 3.8–4.8)
Alkaline Phosphatase: 116 IU/L (ref 44–121)
BUN/Creatinine Ratio: 11 (ref 10–24)
BUN: 15 mg/dL (ref 8–27)
Bilirubin Total: 0.3 mg/dL (ref 0.0–1.2)
CO2: 25 mmol/L (ref 20–29)
Calcium: 9.5 mg/dL (ref 8.6–10.2)
Chloride: 104 mmol/L (ref 96–106)
Creatinine, Ser: 1.36 mg/dL — ABNORMAL HIGH (ref 0.76–1.27)
Globulin, Total: 2.3 g/dL (ref 1.5–4.5)
Glucose: 103 mg/dL — ABNORMAL HIGH (ref 70–99)
Potassium: 4.3 mmol/L (ref 3.5–5.2)
Sodium: 142 mmol/L (ref 134–144)
Total Protein: 7 g/dL (ref 6.0–8.5)
eGFR: 57 mL/min/{1.73_m2} — ABNORMAL LOW (ref >59)

## 2021-09-15 LAB — PSA: Prostate Specific Ag, Serum: 1.3 ng/mL (ref 0.0–4.0)

## 2021-10-06 DIAGNOSIS — Z1211 Encounter for screening for malignant neoplasm of colon: Secondary | ICD-10-CM | POA: Diagnosis not present

## 2021-10-12 LAB — COLOGUARD: COLOGUARD: NEGATIVE

## 2021-12-22 ENCOUNTER — Encounter: Payer: Self-pay | Admitting: Internal Medicine

## 2022-01-04 ENCOUNTER — Encounter: Payer: Self-pay | Admitting: Gastroenterology

## 2022-01-25 ENCOUNTER — Encounter: Payer: Self-pay | Admitting: Internal Medicine

## 2022-02-07 ENCOUNTER — Encounter: Payer: Self-pay | Admitting: Internal Medicine

## 2022-02-13 ENCOUNTER — Other Ambulatory Visit: Payer: Self-pay | Admitting: Family Medicine

## 2022-02-13 DIAGNOSIS — I10 Essential (primary) hypertension: Secondary | ICD-10-CM

## 2022-03-08 ENCOUNTER — Ambulatory Visit (INDEPENDENT_AMBULATORY_CARE_PROVIDER_SITE_OTHER): Payer: Medicare PPO | Admitting: Family Medicine

## 2022-03-08 ENCOUNTER — Encounter: Payer: Self-pay | Admitting: Family Medicine

## 2022-03-08 VITALS — BP 136/78 | HR 60 | Ht 69.0 in | Wt 198.8 lb

## 2022-03-08 DIAGNOSIS — M7918 Myalgia, other site: Secondary | ICD-10-CM | POA: Diagnosis not present

## 2022-03-08 NOTE — Progress Notes (Signed)
   Subjective:    Patient ID: Antonio Harvey, male    DOB: 10-09-1955, 66 y.o.   MRN: [id number]  HPI He has a several month history of difficulty with tenderness palpation over the right lateral rib area.  No nausea no vomiting, pulmonary symptoms, diarrhea.   Review of Systems     Objective:   Physical Exam Alert and in no distress.  Slight tenderness palpation over the anterolateral rib area no palpable lesion is noted.  Abdominal exam shows no masses or tenderness.       Assessment & Plan:  Musculoskeletal pain Explained that since this is palpable pain is most likely musculoskeletal and since it is not interfering with his daily functions, no intervention is really needed at this point.  He was comfortable with that.  Follow-up as needed.

## 2022-03-09 ENCOUNTER — Other Ambulatory Visit: Payer: Self-pay | Admitting: Family Medicine

## 2022-03-09 DIAGNOSIS — E785 Hyperlipidemia, unspecified: Secondary | ICD-10-CM

## 2022-03-13 DIAGNOSIS — S81812A Laceration without foreign body, left lower leg, initial encounter: Secondary | ICD-10-CM | POA: Diagnosis not present

## 2022-03-13 DIAGNOSIS — Z23 Encounter for immunization: Secondary | ICD-10-CM | POA: Diagnosis not present

## 2022-03-13 DIAGNOSIS — S81802A Unspecified open wound, left lower leg, initial encounter: Secondary | ICD-10-CM | POA: Diagnosis not present

## 2022-04-27 ENCOUNTER — Encounter: Payer: Self-pay | Admitting: Internal Medicine

## 2022-05-05 ENCOUNTER — Encounter: Payer: Self-pay | Admitting: Gastroenterology

## 2022-05-24 ENCOUNTER — Other Ambulatory Visit: Payer: Self-pay | Admitting: Family Medicine

## 2022-05-24 DIAGNOSIS — E785 Hyperlipidemia, unspecified: Secondary | ICD-10-CM

## 2022-05-24 HISTORY — PX: OTHER SURGICAL HISTORY: SHX169

## 2022-06-08 ENCOUNTER — Ambulatory Visit (AMBULATORY_SURGERY_CENTER): Payer: TRICARE For Life (TFL) | Admitting: *Deleted

## 2022-06-08 VITALS — Ht 69.0 in | Wt 196.0 lb

## 2022-06-08 DIAGNOSIS — Z1211 Encounter for screening for malignant neoplasm of colon: Secondary | ICD-10-CM

## 2022-06-08 MED ORDER — NA SULFATE-K SULFATE-MG SULF 17.5-3.13-1.6 GM/177ML PO SOLN: 1.0000 | Freq: Once | ORAL | 0 refills | Status: AC

## 2022-06-08 NOTE — Progress Notes (Signed)
No egg or soy allergy known to patient  No issues known to pt with past sedation with any surgeries or procedures Patient denies ever being told they had issues or difficulty with intubation  No FH of Malignant Hyperthermia Pt is not on diet pills Pt is not on  home 02  Pt is not on blood thinners  Pt denies issues with constipation  Pt is not on dialysis Pt denies any upcoming cardiac testing Pt encouraged to use to use Singlecare or Goodrx to reduce cost  Patient's chart reviewed by Antonio Harvey CNRA prior to previsit and patient appropriate for the [facility].  Previsit completed and red dot placed by patient's name on their procedure day (on provider's schedule).  . Visit by phone Instructions reviewed with pt and pt states understanding. Instructed to review again prior to procedure. Pt states they will. Instructions sent by mail and my chart

## 2022-06-22 ENCOUNTER — Encounter: Payer: Self-pay | Admitting: Gastroenterology

## 2022-06-22 ENCOUNTER — Ambulatory Visit (AMBULATORY_SURGERY_CENTER): Payer: Medicare PPO | Admitting: Gastroenterology

## 2022-06-22 VITALS — BP 139/64 | HR 54 | Temp 99.1°F | Resp 11 | Ht 69.0 in | Wt 196.0 lb

## 2022-06-22 DIAGNOSIS — Z86010 Personal history of colonic polyps: Secondary | ICD-10-CM | POA: Diagnosis not present

## 2022-06-22 DIAGNOSIS — Z09 Encounter for follow-up examination after completed treatment for conditions other than malignant neoplasm: Secondary | ICD-10-CM | POA: Diagnosis not present

## 2022-06-22 DIAGNOSIS — G4730 Sleep apnea, unspecified: Secondary | ICD-10-CM | POA: Diagnosis not present

## 2022-06-22 DIAGNOSIS — I10 Essential (primary) hypertension: Secondary | ICD-10-CM | POA: Diagnosis not present

## 2022-06-22 DIAGNOSIS — E785 Hyperlipidemia, unspecified: Secondary | ICD-10-CM | POA: Diagnosis not present

## 2022-06-22 DIAGNOSIS — D128 Benign neoplasm of rectum: Secondary | ICD-10-CM | POA: Diagnosis not present

## 2022-06-22 MED ORDER — SODIUM CHLORIDE 0.9 % IV SOLN: 500.0000 mL | Freq: Once | INTRAVENOUS | Status: DC

## 2022-06-22 NOTE — Op Note (Signed)
[facility] Patient Name: Aviv Flannelly Procedure Date: 06/22/2022 10:47 AM MRN: [id number] Endoscopist: Mallie Mussel L. Loletha Carrow , MD, [id number] Age: 67 Referring MD:  Date of Birth: 1956-03-11 Gender: Male Account #: [id number] Procedure:                Colonoscopy Indications:              Surveillance: Personal history of adenomatous                            polyps on last colonoscopy > 5 years ago                           Hyperplastic rectal polyp last colonoscopy August                            2013                           Rectal tubular adenoma February 2007 Medicines:                Monitored Anesthesia Care Procedure:                Pre-Anesthesia Assessment:                           - Prior to the procedure, a History and Physical                            was performed, and patient medications and                            allergies were reviewed. The patient's tolerance of                            previous anesthesia was also reviewed. The risks                            and benefits of the procedure and the sedation                            options and risks were discussed with the patient.                            All questions were answered, and informed consent                            was obtained. Prior Anticoagulants: The patient has                            taken no anticoagulant or antiplatelet agents. ASA                            Grade Assessment: II - A patient with mild systemic  disease. After reviewing the risks and benefits,                            the patient was deemed in satisfactory condition to                            undergo the procedure.                           After obtaining informed consent, the colonoscope                            was passed under direct vision. Throughout the                            procedure, the patient's blood pressure, pulse, and                            oxygen  saturations were monitored continuously. The                            CF [id number] was introduced through the anus                            and advanced to the the cecum, identified by                            appendiceal orifice and ileocecal valve. The                            colonoscopy was performed without difficulty. The                            patient tolerated the procedure well. The quality                            of the bowel preparation was good (some residual                            diverticular stool balls in left colon). The                            ileocecal valve, appendiceal orifice, and rectum                            were photographed. Scope In: 10:54:37 AM Scope Out: 11:10:47 AM Scope Withdrawal Time: 0 hours 13 minutes 24 seconds  Total Procedure Duration: 0 hours 16 minutes 10 seconds  Findings:                 The perianal and digital rectal examinations were                            normal.  Repeat examination of right colon under NBI                            performed.                           Many diverticula were found in the left colon and                            right colon. (Left > R with associated haustral                            thickening)                           A 5 mm polyp was found in the rectum. The polyp was                            sessile. The polyp was removed with a cold snare.                            Resection and retrieval were complete.                           The exam was otherwise without abnormality on                            direct and retroflexion views. Complications:            No immediate complications. Estimated Blood Loss:     Estimated blood loss was minimal. Impression:               - Diverticulosis in the left colon and in the right                            colon.                           - One 5 mm polyp in the rectum, removed with a cold                             snare. Resected and retrieved.                           - The examination was otherwise normal on direct                            and retroflexion views. Recommendation:           - Patient has a contact number available for                            emergencies. The signs and symptoms of potential                            delayed complications were  discussed with the                            patient. Return to normal activities tomorrow.                            Written discharge instructions were provided to the                            patient.                           - Resume previous diet.                           - Continue present medications.                           - Await pathology results.                           - Repeat colonoscopy is recommended for                            surveillance. The colonoscopy date will be                            determined after pathology results from today's                            exam become available for review. Kaelen Caughlin L. Loletha Carrow, MD 06/22/2022 11:15:16 AM This report has been signed electronically.

## 2022-06-22 NOTE — Progress Notes (Signed)
History and Physical:  This patient presents for endoscopic testing for: Encounter Diagnosis  Name Primary?   Personal history of colonic polyps Yes    TA 2007, HP 2013 Patient denies chronic abdominal pain, rectal bleeding, constipation or diarrhea.  Patient is otherwise without complaints or active issues today.   Past Medical History: Past Medical History:  Diagnosis Date   Allergy    Chronic LBP    Diverticulosis    History of colonic polyps    Hyperlipidemia    Hypertension    Sleep apnea      Past Surgical History: Past Surgical History:  Procedure Laterality Date   CYST REMOVAL HAND Right 04/30/2021   GANGLION CYST EXCISION Left 05/27/2015   Procedure: REMOVAL GANGLION CYST OF LEFT 3RD FINGER;  Surgeon: Leandrew Koyanagi, MD;  Location: [facility];  Service: Orthopedics;  Laterality: Left;   INCISION AND DRAINAGE PERIRECTAL ABSCESS N/A 06/28/2013   Procedure: IRRIGATION AND DEBRIDEMENT PERIRECTAL ABSCESS;  Surgeon: Harl Bowie, MD;  Location: [facility];  Service: General;  Laterality: N/A;   TONSILLECTOMY AND ADENOIDECTOMY  1966    Allergies: Allergies  Allergen Reactions   Sildenafil Palpitations    Outpatient Meds: Current Outpatient Medications  Medication Sig Dispense Refill   atorvastatin (LIPITOR) 40 MG tablet TAKE 1 TABLET(40 MG) BY MOUTH DAILY 90 tablet 1   losartan (COZAAR) 50 MG tablet TAKE 1 TABLET(50 MG) BY MOUTH DAILY 90 tablet 1   acetaminophen (TYLENOL) 500 MG tablet Take 500 mg by mouth every 6 (six) hours as needed.     diclofenac Sodium (VOLTAREN) 1 % GEL APPLY 2 GRAMS TO AFFECTED AREA EVERY 6 HOURS AS NEEDED     fluticasone (FLONASE) 50 MCG/ACT nasal spray      tadalafil (CIALIS) 20 MG tablet Take 1 tablet (20 mg total) by mouth daily as needed for erectile dysfunction. 10 tablet 0   TRAZODONE HCL PO Take by mouth.     Current Facility-Administered Medications  Medication Dose Route Frequency Provider Last Rate Last Admin    0.9 %  sodium chloride infusion  500 mL Intravenous Once Nelida Meuse III, MD          ___________________________________________________________________ Objective   Exam:  BP (!) 152/77   Pulse 71   Temp 99.1 F (37.3 C)   Ht '5\' 9"'$  (1.753 m)   Wt 196 lb (88.9 kg)   SpO2 99%   BMI 28.94 kg/m   CV: regular , S1/S2 Resp: clear to auscultation bilaterally, normal RR and effort noted GI: soft, no tenderness, with active bowel sounds.   Assessment: Encounter Diagnosis  Name Primary?   Personal history of colonic polyps Yes     Plan: Colonoscopy  The benefits and risks of the planned procedure were described in detail with the patient or (when appropriate) their health care proxy.  Risks were outlined as including, but not limited to, bleeding, infection, perforation, adverse medication reaction leading to cardiac or pulmonary decompensation, pancreatitis (if ERCP).  The limitation of incomplete mucosal visualization was also discussed.  No guarantees or warranties were given.    The patient is appropriate for an endoscopic procedure in the ambulatory setting.   - Wilfrid Lund, MD

## 2022-06-22 NOTE — Progress Notes (Signed)
Called to room to assist during endoscopic procedure.  Patient ID and intended procedure confirmed with present staff. Received instructions for my participation in the procedure from the performing physician.  

## 2022-06-22 NOTE — Progress Notes (Signed)
PT taken to PACU. Monitors in place. VSS. Report given to RN. 

## 2022-06-22 NOTE — Progress Notes (Signed)
Pt's states no medical or surgical changes since previsit or office visit. 

## 2022-06-22 NOTE — Patient Instructions (Signed)
Resume previous diet and medications. Awaiting pathology results. Repeat Colonoscopy date to be determined based on pathology results. Handouts provided on Colon polyps and Diverticulosis  YOU HAD AN ENDOSCOPIC PROCEDURE TODAY AT [facility]:   Refer to the procedure report that was given to you for any specific questions about what was found during the examination.  If the procedure report does not answer your questions, please call your gastroenterologist to clarify.  If you requested that your care partner not be given the details of your procedure findings, then the procedure report has been included in a sealed envelope for you to review at your convenience later.  YOU SHOULD EXPECT: Some feelings of bloating in the abdomen. Passage of more gas than usual.  Walking can help get rid of the air that was put into your GI tract during the procedure and reduce the bloating. If you had a lower endoscopy (such as a colonoscopy or flexible sigmoidoscopy) you may notice spotting of blood in your stool or on the toilet paper. If you underwent a bowel prep for your procedure, you may not have a normal bowel movement for a few days.  Please Note:  You might notice some irritation and congestion in your nose or some drainage.  This is from the oxygen used during your procedure.  There is no need for concern and it should clear up in a day or so.  SYMPTOMS TO REPORT IMMEDIATELY:  Following lower endoscopy (colonoscopy or flexible sigmoidoscopy):  Excessive amounts of blood in the stool  Significant tenderness or worsening of abdominal pains  Swelling of the abdomen that is new, acute  Fever of 100F or higher  For urgent or emergent issues, a gastroenterologist can be reached at any hour by calling [phone]. Do not use MyChart messaging for urgent concerns.    DIET:  We do recommend a small meal at first, but then you may proceed to your regular diet.  Drink plenty of fluids but  you should avoid alcoholic beverages for 24 hours.  ACTIVITY:  You should plan to take it easy for the rest of today and you should NOT DRIVE or use heavy machinery until tomorrow (because of the sedation medicines used during the test).    FOLLOW UP: Our staff will call the number listed on your records the next business day following your procedure.  We will call around 7:15- 8:00 am to check on you and address any questions or concerns that you may have regarding the information given to you following your procedure. If we do not reach you, we will leave a message.     If any biopsies were taken you will be contacted by phone or by letter within the next 1-3 weeks.  Please call us at [phone] if you have not heard about the biopsies in 3 weeks.    SIGNATURES/CONFIDENTIALITY: You and/or your care partner have signed paperwork which will be entered into your electronic medical record.  These signatures attest to the fact that that the information above on your After Visit Summary has been reviewed and is understood.  Full responsibility of the confidentiality of this discharge information lies with you and/or your care-partner.

## 2022-06-23 ENCOUNTER — Telehealth: Payer: Self-pay

## 2022-06-23 NOTE — Telephone Encounter (Signed)
  Follow up Call-     06/22/2022   10:16 AM  Call back number  Post procedure Call Back phone  # [phone]  Permission to leave phone message Yes     Patient questions:  Do you have a fever, pain , or abdominal swelling? No. Pain Score  0 *  Have you tolerated food without any problems? Yes.    Have you been able to return to your normal activities? Yes.    Do you have any questions about your discharge instructions: Diet   No. Medications  No. Follow up visit  No.  Do you have questions or concerns about your Care? No.  Actions: * If pain score is 4 or above: No action needed, pain <4.

## 2022-06-27 ENCOUNTER — Encounter: Payer: Self-pay | Admitting: Gastroenterology

## 2022-08-08 ENCOUNTER — Telehealth: Payer: Self-pay | Admitting: Family Medicine

## 2022-08-08 NOTE — Telephone Encounter (Signed)
Contacted Antonio Harvey to schedule their annual wellness visit. Appointment made for 08/16/22.  Antonio Harvey AWV direct phone # [phone]   R/s 5/3 awv appt to 4/30  pt aware

## 2022-08-16 ENCOUNTER — Ambulatory Visit (INDEPENDENT_AMBULATORY_CARE_PROVIDER_SITE_OTHER): Payer: Medicare PPO

## 2022-08-16 VITALS — BP 130/64 | HR 78 | Temp 97.7°F | Ht 69.5 in | Wt 199.4 lb

## 2022-08-16 DIAGNOSIS — Z0000 Encounter for general adult medical examination without abnormal findings: Secondary | ICD-10-CM | POA: Diagnosis not present

## 2022-08-16 NOTE — Progress Notes (Signed)
Subjective:   Antonio Harvey is a 67 y.o. male who presents for Medicare Annual/Subsequent preventive examination.  Review of Systems     Cardiac Risk Factors include: advanced age (>15men, >86 women);dyslipidemia;hypertension;male gender     Objective:    Today's Vitals   08/16/22 0827 08/16/22 0831  BP: 130/64   Pulse: 78   Temp: 97.7 F (36.5 C)   TempSrc: Oral   SpO2: 97%   Weight: 199 lb 6.4 oz (90.4 kg)   Height: 5' 9.5" (1.765 m)   PainSc:  4    Body mass index is 29.02 kg/m.     08/16/2022    8:40 AM 08/13/2021    8:52 AM 07/17/2020    1:45 PM 09/13/2019    9:20 PM 05/27/2015   10:07 AM 05/21/2015    1:44 PM 06/28/2013    1:00 AM  Advanced Directives  Does Patient Have a Medical Advance Directive? No No Yes No No No Patient does not have advance directive  Type of Advance Directive   Living will      Does patient want to make changes to medical advance directive?   Yes (Inpatient - patient defers changing a medical advance directive and declines information at this time)      Would patient like information on creating a medical advance directive? No - Patient declined No - Patient declined   Yes - Educational materials given    Pre-existing out of facility DNR order (yellow form or pink MOST form)       No    Current Medications (verified) Outpatient Encounter Medications as of 08/16/2022  Medication Sig   acetaminophen (TYLENOL) 500 MG tablet Take 500 mg by mouth every 6 (six) hours as needed.   atorvastatin (LIPITOR) 40 MG tablet TAKE 1 TABLET(40 MG) BY MOUTH DAILY   diclofenac Sodium (VOLTAREN) 1 % GEL APPLY 2 GRAMS TO AFFECTED AREA EVERY 6 HOURS AS NEEDED   fluticasone (FLONASE) 50 MCG/ACT nasal spray    losartan (COZAAR) 50 MG tablet TAKE 1 TABLET(50 MG) BY MOUTH DAILY   tadalafil (CIALIS) 20 MG tablet Take 1 tablet (20 mg total) by mouth daily as needed for erectile dysfunction.   TRAZODONE HCL PO Take by mouth.   No facility-administered encounter  medications on file as of 08/16/2022.    Allergies (verified) Sildenafil   History: Past Medical History:  Diagnosis Date   Allergy    Chronic LBP    Diverticulosis    History of colonic polyps    Hyperlipidemia    Hypertension    Sleep apnea    Past Surgical History:  Procedure Laterality Date   CYST REMOVAL HAND Right 04/30/2021   GANGLION CYST EXCISION Left 05/27/2015   Procedure: REMOVAL GANGLION CYST OF LEFT 3RD FINGER;  Surgeon: Tarry Kos, MD;  Location: [facility];  Service: Orthopedics;  Laterality: Left;   INCISION AND DRAINAGE PERIRECTAL ABSCESS N/A 06/28/2013   Procedure: IRRIGATION AND DEBRIDEMENT PERIRECTAL ABSCESS;  Surgeon: Shelly Rubenstein, MD;  Location: [facility];  Service: General;  Laterality: N/A;   thumb surgery Right 05/24/2022   TONSILLECTOMY AND ADENOIDECTOMY  1966   Family History  Problem Relation Age of Onset   Colon polyps Mother    Asthma Mother    Hypertension Mother    Stroke Father        died of CVA   Kidney disease Father        dialysis   Diabetes Father  Arthritis Sister    Colon cancer Neg Hx    Esophageal cancer Neg Hx    Stomach cancer Neg Hx    Rectal cancer Neg Hx    Social History   Socioeconomic History   Marital status: Married    Spouse name: Not on file   Number of children: Not on file   Years of education: Not on file   Highest education level: Not on file  Occupational History   Not on file  Tobacco Use   Smoking status: Never   Smokeless tobacco: Never  Vaping Use   Vaping Use: Never used  Substance and Sexual Activity   Alcohol use: No   Drug use: No   Sexual activity: Yes  Other Topics Concern   Not on file  Social History Narrative   Not on file   Social Determinants of Health   Financial Resource Strain: Low Risk  (08/16/2022)   Overall Financial Resource Strain (CARDIA)    Difficulty of Paying Living Expenses: Not hard at all  Food Insecurity: No Food Insecurity (08/16/2022)    Hunger Vital Sign    Worried About Running Out of Food in the Last Year: Never true    Ran Out of Food in the Last Year: Never true  Transportation Needs: No Transportation Needs (08/16/2022)   PRAPARE - Administrator, Civil Service (Medical): No    Lack of Transportation (Non-Medical): No  Physical Activity: Inactive (08/16/2022)   Exercise Vital Sign    Days of Exercise per Week: 0 days    Minutes of Exercise per Session: 0 min  Stress: No Stress Concern Present (08/16/2022)   [employer] of Occupational Health - Occupational Stress Questionnaire    Feeling of Stress : Not at all  Social Connections: Not on file    Tobacco Counseling Counseling given: Not Answered   Clinical Intake:  Pre-visit preparation completed: Yes  Pain : 0-10 Pain Score: 4  Pain Type: Chronic pain Pain Location: Knee Pain Orientation: Right Pain Descriptors / Indicators: Aching Pain Onset: More than a month ago Pain Frequency: Constant     Nutritional Status: BMI 25 -29 Overweight Nutritional Risks: None Diabetes: No  How often do you need to have someone help you when you read instructions, pamphlets, or other written materials from your doctor or pharmacy?: 1 - Never  Diabetic? no  Interpreter Needed?: No  Information entered by :: NAllen LPN   Activities of Daily Living    08/16/2022    8:44 AM  In your present state of health, do you have any difficulty performing the following activities:  Hearing? 0  Vision? 0  Difficulty concentrating or making decisions? 0  Walking or climbing stairs? 1  Comment due to knee  Dressing or bathing? 0  Doing errands, shopping? 0  Preparing Food and eating ? N  Using the Toilet? N  In the past six months, have you accidently leaked urine? N  Do you have problems with loss of bowel control? N  Managing your Medications? N  Managing your Finances? N  Housekeeping or managing your Housekeeping? N    Patient Care  Team: Ronnald Nian, MD as PCP - General (Family Medicine)  Indicate any recent Medical Services you may have received from other than [facility] providers in the past year (date may be approximate).     Assessment:   This is a routine wellness examination for [place].  Hearing/Vision screen Vision Screening - Comments:: [facility]  Dietary issues and exercise activities discussed: Current Exercise Habits: The patient does not participate in regular exercise at present   Goals Addressed             This Visit's Progress    Patient Stated       08/16/2022, wants to get knee taken care of       Depression Screen    08/16/2022    8:44 AM 08/13/2021    8:54 AM 07/17/2020    1:47 PM 12/26/2019   11:11 AM  PHQ 2/9 Scores  PHQ - 2 Score 0 0 0 1  PHQ- 9 Score  3      Fall Risk    08/16/2022    8:41 AM 03/08/2022    8:20 AM 08/13/2021    8:54 AM 07/17/2020    1:46 PM  Fall Risk   Falls in the past year? 1 0 1 0  Comment fell in to the dump  tripped   Number falls in past yr: 0 0 1 0  Injury with Fall? 0 0 0 0  Comment cut leg     Risk for fall due to : Medication side effect No Fall Risks Medication side effect No Fall Risks  Follow up Falls prevention discussed;Education provided;Falls evaluation completed Falls evaluation completed Falls evaluation completed;Education provided;Falls prevention discussed Falls evaluation completed    FALL RISK PREVENTION PERTAINING TO THE HOME:  Any stairs in or around the home? Yes  If so, are there any without handrails? No  Home free of loose throw rugs in walkways, pet beds, electrical cords, etc? Yes  Adequate lighting in your home to reduce risk of falls? Yes   ASSISTIVE DEVICES UTILIZED TO PREVENT FALLS:  Life alert? No  Use of a cane, walker or w/c? No  Grab bars in the bathroom? Yes  Shower chair or bench in shower? Yes  Elevated toilet seat or a handicapped toilet? Yes   TIMED UP AND GO:  Was the  test performed? Yes .  Length of time to ambulate 10 feet: 6 sec.   Gait slow and steady without use of assistive device  Cognitive Function:        08/16/2022    8:46 AM 08/13/2021    9:00 AM  6CIT Screen  What Year? 0 points 0 points  What month? 0 points 0 points  What time? 0 points 0 points  Count back from 20 0 points 0 points  Months in reverse 0 points 0 points  Repeat phrase 0 points 0 points  Total Score 0 points 0 points    Immunizations Immunization History  Administered Date(s) Administered   Fluad Quad(high Dose 65+) 12/26/2019, 02/01/2021   Influenza Split 01/16/2014, 12/21/2015, 02/08/2022   Influenza,inj,Quad PF,6+ Mos 03/19/2013, 04/29/2015, 03/13/2017   Influenza,inj,quad, With Preservative 06/11/2018   Influenza-Unspecified 01/16/2010, 01/17/2012, 01/27/2016, 12/18/2019   PFIZER(Purple Top)SARS-COV-2 Vaccination 05/24/2019, 06/14/2019, 02/13/2020   Pneumococcal Polysaccharide-23 05/05/2020   Tdap 03/19/2013, 11/20/2014   Zoster Recombinat (Shingrix) 06/25/2018, 08/27/2018   Zoster, Live 04/29/2015    TDAP status: Up to date  Flu Vaccine status: Up to date  Pneumococcal vaccine status: Up to date  Covid-19 vaccine status: Completed vaccines  Qualifies for Shingles Vaccine? Yes   Zostavax completed Yes   Shingrix Completed?: Yes  Screening Tests Health Maintenance  Topic Date Due   Pneumonia Vaccine 38+ Years old (2 of 2 - PCV) 05/05/2021   COVID-19 Vaccine (4 - 2023-24  season) 12/17/2021   INFLUENZA VACCINE  11/17/2022   Medicare Annual Wellness (AWV)  08/16/2023   DTaP/Tdap/Td (3 - Td or Tdap) 11/19/2024   COLONOSCOPY (Pts 45-42yrs Insurance coverage will need to be confirmed)  06/21/2029   Hepatitis C Screening  Completed   Zoster Vaccines- Shingrix  Completed   HPV VACCINES  Aged Out    Health Maintenance  Health Maintenance Due  Topic Date Due   Pneumonia Vaccine 64+ Years old (2 of 2 - PCV) 05/05/2021   COVID-19 Vaccine (4 -  2023-24 season) 12/17/2021    Colorectal cancer screening: Type of screening: Colonoscopy. Completed 06/22/2022. Repeat every 7 years  Lung Cancer Screening: (Low Dose CT Chest recommended if Age 71-80 years, 30 pack-year currently smoking OR have quit w/in 15years.) does not qualify.   Lung Cancer Screening Referral: no  Additional Screening:  Hepatitis C Screening: does qualify; Completed 04/29/2015  Vision Screening: Recommended annual ophthalmology exams for early detection of glaucoma and other disorders of the eye. Is the patient up to date with their annual eye exam?  Yes  Who is the provider or what is the name of the office in which the patient attends annual eye exams? [facility] If pt is not established with a provider, would they like to be referred to a provider to establish care? No .   Dental Screening: Recommended annual dental exams for proper oral hygiene  Community Resource Referral / Chronic Care Management: CRR required this visit?  No   CCM required this visit?  No      Plan:     I have personally reviewed and noted the following in the patient's chart:   Medical and social history Use of alcohol, tobacco or illicit drugs  Current medications and supplements including opioid prescriptions. Patient is not currently taking opioid prescriptions. Functional ability and status Nutritional status Physical activity Advanced directives List of other physicians Hospitalizations, surgeries, and ER visits in previous 12 months Vitals Screenings to include cognitive, depression, and falls Referrals and appointments  In addition, I have reviewed and discussed with patient certain preventive protocols, quality metrics, and best practice recommendations. A written personalized care plan for preventive services as well as general preventive health recommendations were provided to patient.     Barb Merino, LPN   [id number]   Nurse Notes: none

## 2022-08-16 NOTE — Patient Instructions (Addendum)
Antonio Harvey , Thank you for taking time to come for your Medicare Wellness Visit. I appreciate your ongoing commitment to your health goals. Please review the following plan we discussed and let me know if I can assist you in the future.   These are the goals we discussed:  Goals      Patient Stated     08/13/2021, start exercising when knee gets better     Patient Stated     08/16/2022, wants to get knee taken care of        This is a list of the screening recommended for you and due dates:  Health Maintenance  Topic Date Due   Pneumonia Vaccine (2 of 2 - PCV) 05/05/2021   COVID-19 Vaccine (4 - 2023-24 season) 12/17/2021   Flu Shot  11/17/2022   Medicare Annual Wellness Visit  08/16/2023   DTaP/Tdap/Td vaccine (3 - Td or Tdap) 11/19/2024   Colon Cancer Screening  06/21/2029   Hepatitis C Screening: USPSTF Recommendation to screen - Ages 18-79 yo.  Completed   Zoster (Shingles) Vaccine  Completed   HPV Vaccine  Aged Out    Advanced directives: Advance directive discussed with you today. Even though you declined this today please call our office should you change your mind and we can give you the proper paperwork for you to fill out.  Conditions/risks identified: none  Next appointment: Follow up in one year for your annual wellness visit.   Preventive Care 8 Years and Older, Male  Preventive care refers to lifestyle choices and visits with your health care provider that can promote health and wellness. What does preventive care include? A yearly physical exam. This is also called an annual well check. Dental exams once or twice a year. Routine eye exams. Ask your health care provider how often you should have your eyes checked. Personal lifestyle choices, including: Daily care of your teeth and gums. Regular physical activity. Eating a healthy diet. Avoiding tobacco and drug use. Limiting alcohol use. Practicing safe sex. Taking low doses of aspirin every day. Taking  vitamin and mineral supplements as recommended by your health care provider. What happens during an annual well check? The services and screenings done by your health care provider during your annual well check will depend on your age, overall health, lifestyle risk factors, and family history of disease. Counseling  Your health care provider may ask you questions about your: Alcohol use. Tobacco use. Drug use. Emotional well-being. Home and relationship well-being. Sexual activity. Eating habits. History of falls. Memory and ability to understand (cognition). Work and work Astronomer. Screening  You may have the following tests or measurements: Height, weight, and BMI. Blood pressure. Lipid and cholesterol levels. These may be checked every 5 years, or more frequently if you are over 92 years old. Skin check. Lung cancer screening. You may have this screening every year starting at age 20 if you have a 30-pack-year history of smoking and currently smoke or have quit within the past 15 years. Fecal occult blood test (FOBT) of the stool. You may have this test every year starting at age 46. Flexible sigmoidoscopy or colonoscopy. You may have a sigmoidoscopy every 5 years or a colonoscopy every 10 years starting at age 29. Prostate cancer screening. Recommendations will vary depending on your family history and other risks. Hepatitis C blood test. Hepatitis B blood test. Sexually transmitted disease (STD) testing. Diabetes screening. This is done by checking your blood sugar (glucose) after you  have not eaten for a while (fasting). You may have this done every 1-3 years. Abdominal aortic aneurysm (AAA) screening. You may need this if you are a current or former smoker. Osteoporosis. You may be screened starting at age 49 if you are at high risk. Talk with your health care provider about your test results, treatment options, and if necessary, the need for more tests. Vaccines  Your  health care provider may recommend certain vaccines, such as: Influenza vaccine. This is recommended every year. Tetanus, diphtheria, and acellular pertussis (Tdap, Td) vaccine. You may need a Td booster every 10 years. Zoster vaccine. You may need this after age 23. Pneumococcal 13-valent conjugate (PCV13) vaccine. One dose is recommended after age 1. Pneumococcal polysaccharide (PPSV23) vaccine. One dose is recommended after age 42. Talk to your health care provider about which screenings and vaccines you need and how often you need them. This information is not intended to replace advice given to you by your health care provider. Make sure you discuss any questions you have with your health care provider. Document Released: 05/01/2015 Document Revised: 12/23/2015 Document Reviewed: 02/03/2015 Elsevier Interactive Patient Education  2017 [place] Prevention in the Home Falls can cause injuries. They can happen to people of all ages. There are many things you can do to make your home safe and to help prevent falls. What can I do on the outside of my home? Regularly fix the edges of walkways and driveways and fix any cracks. Remove anything that might make you trip as you walk through a door, such as a raised step or threshold. Trim any bushes or trees on the path to your home. Use bright outdoor lighting. Clear any walking paths of anything that might make someone trip, such as rocks or tools. Regularly check to see if handrails are loose or broken. Make sure that both sides of any steps have handrails. Any raised decks and porches should have guardrails on the edges. Have any leaves, snow, or ice cleared regularly. Use sand or salt on walking paths during winter. Clean up any spills in your garage right away. This includes oil or grease spills. What can I do in the bathroom? Use night lights. Install grab bars by the toilet and in the tub and shower. Do not use towel bars as  grab bars. Use non-skid mats or decals in the tub or shower. If you need to sit down in the shower, use a plastic, non-slip stool. Keep the floor dry. Clean up any water that spills on the floor as soon as it happens. Remove soap buildup in the tub or shower regularly. Attach bath mats securely with double-sided non-slip rug tape. Do not have throw rugs and other things on the floor that can make you trip. What can I do in the bedroom? Use night lights. Make sure that you have a light by your bed that is easy to reach. Do not use any sheets or blankets that are too big for your bed. They should not hang down onto the floor. Have a firm chair that has side arms. You can use this for support while you get dressed. Do not have throw rugs and other things on the floor that can make you trip. What can I do in the kitchen? Clean up any spills right away. Avoid walking on wet floors. Keep items that you use a lot in easy-to-reach places. If you need to reach something above you, use a strong step  stool that has a grab bar. Keep electrical cords out of the way. Do not use floor polish or wax that makes floors slippery. If you must use wax, use non-skid floor wax. Do not have throw rugs and other things on the floor that can make you trip. What can I do with my stairs? Do not leave any items on the stairs. Make sure that there are handrails on both sides of the stairs and use them. Fix handrails that are broken or loose. Make sure that handrails are as long as the stairways. Check any carpeting to make sure that it is firmly attached to the stairs. Fix any carpet that is loose or worn. Avoid having throw rugs at the top or bottom of the stairs. If you do have throw rugs, attach them to the floor with carpet tape. Make sure that you have a light switch at the top of the stairs and the bottom of the stairs. If you do not have them, ask someone to add them for you. What else can I do to help prevent  falls? Wear shoes that: Do not have high heels. Have rubber bottoms. Are comfortable and fit you well. Are closed at the toe. Do not wear sandals. If you use a stepladder: Make sure that it is fully opened. Do not climb a closed stepladder. Make sure that both sides of the stepladder are locked into place. Ask someone to hold it for you, if possible. Clearly mark and make sure that you can see: Any grab bars or handrails. First and last steps. Where the edge of each step is. Use tools that help you move around (mobility aids) if they are needed. These include: Canes. Walkers. Scooters. Crutches. Turn on the lights when you go into a dark area. Replace any light bulbs as soon as they burn out. Set up your furniture so you have a clear path. Avoid moving your furniture around. If any of your floors are uneven, fix them. If there are any pets around you, be aware of where they are. Review your medicines with your doctor. Some medicines can make you feel dizzy. This can increase your chance of falling. Ask your doctor what other things that you can do to help prevent falls. This information is not intended to replace advice given to you by your health care provider. Make sure you discuss any questions you have with your health care provider. Document Released: 01/29/2009 Document Revised: 09/10/2015 Document Reviewed: 05/09/2014 Elsevier Interactive Patient Education  2017 [employer].

## 2022-08-19 ENCOUNTER — Ambulatory Visit: Payer: Medicare PPO

## 2022-09-27 ENCOUNTER — Ambulatory Visit (INDEPENDENT_AMBULATORY_CARE_PROVIDER_SITE_OTHER): Payer: Medicare PPO | Admitting: Family Medicine

## 2022-09-27 ENCOUNTER — Encounter: Payer: Self-pay | Admitting: Family Medicine

## 2022-09-27 VITALS — BP 128/62 | HR 69 | Temp 97.9°F | Resp 14 | Ht 69.5 in | Wt 195.4 lb

## 2022-09-27 DIAGNOSIS — E785 Hyperlipidemia, unspecified: Secondary | ICD-10-CM

## 2022-09-27 DIAGNOSIS — Z131 Encounter for screening for diabetes mellitus: Secondary | ICD-10-CM

## 2022-09-27 DIAGNOSIS — G4730 Sleep apnea, unspecified: Secondary | ICD-10-CM | POA: Diagnosis not present

## 2022-09-27 DIAGNOSIS — H6123 Impacted cerumen, bilateral: Secondary | ICD-10-CM

## 2022-09-27 DIAGNOSIS — Z860100 Personal history of colon polyps, unspecified: Secondary | ICD-10-CM

## 2022-09-27 DIAGNOSIS — I10 Essential (primary) hypertension: Secondary | ICD-10-CM | POA: Diagnosis not present

## 2022-09-27 DIAGNOSIS — N529 Male erectile dysfunction, unspecified: Secondary | ICD-10-CM | POA: Diagnosis not present

## 2022-09-27 DIAGNOSIS — E7439 Other disorders of intestinal carbohydrate absorption: Secondary | ICD-10-CM | POA: Diagnosis not present

## 2022-09-27 DIAGNOSIS — J309 Allergic rhinitis, unspecified: Secondary | ICD-10-CM | POA: Diagnosis not present

## 2022-09-27 DIAGNOSIS — K5730 Diverticulosis of large intestine without perforation or abscess without bleeding: Secondary | ICD-10-CM

## 2022-09-27 DIAGNOSIS — Z0000 Encounter for general adult medical examination without abnormal findings: Secondary | ICD-10-CM

## 2022-09-27 MED ORDER — ATORVASTATIN CALCIUM 40 MG PO TABS: ORAL_TABLET | ORAL | 3 refills | Status: DC

## 2022-09-27 MED ORDER — LOSARTAN POTASSIUM 50 MG PO TABS: ORAL_TABLET | ORAL | 3 refills | Status: DC

## 2022-09-27 NOTE — Progress Notes (Signed)
Complete physical exam  Patient: Antonio Harvey   DOB: 1956-02-20   67 y.o. Male  MRN: [id number]  Subjective:    Chief Complaint  Patient presents with   Annual Exam    Had AWV on 08/16/2022 with [facility].     Antonio Harvey is a 67 y.o. male who presents today for a complete physical exam. He reports consuming a general diet. Home exercise routine includes light walking and stretching. He generally feels fairly well. He reports sleeping poorly. He gets his care through the [place].  He has had recent right knee surgery on June 3 and seems to recover nicely from that.  He has also had a ganglion removed from his right thumb recently.  Does have OSA and CPAP is working quite well.  He did have a recent colonoscopy which did show adenomatous polyp and is scheduled for 5-year recheck.  Continues on atorvastatin.  Is also taking losartan for his blood pressure.  Uses Cialis on an as-needed basis.  Does have trazodone but does not really take it.  Recent blood work was evaluated and does show a hemoglobin A1c of 6.1.  His allergies seem to be under good control.   Most recent fall risk assessment:    08/16/2022    8:41 AM  Fall Risk   Falls in the past year? 1  Comment [place] the dump  Number falls in past yr: 0  Injury with Fall? 0  Comment cut leg  Risk for fall due to : Medication side effect  Follow up Falls prevention discussed;Education provided;Falls evaluation completed     Most recent depression screenings:    08/16/2022    8:44 AM 08/13/2021    8:54 AM  PHQ 2/9 Scores  PHQ - 2 Score 0 0  PHQ- 9 Score  3    Vision:Within last year and Dental: Receives regular dental care    Patient Care Team: Ronnald Nian, MD as PCP - General (Family Medicine)   Outpatient Medications Prior to Visit  Medication Sig   acetaminophen (TYLENOL) 500 MG tablet Take 500 mg by mouth every 6 (six) hours as needed.   diclofenac Sodium (VOLTAREN) 1 % GEL APPLY 2 GRAMS TO AFFECTED AREA EVERY 6  HOURS AS NEEDED   fluticasone (FLONASE) 50 MCG/ACT nasal spray    tadalafil (CIALIS) 20 MG tablet Take 1 tablet (20 mg total) by mouth daily as needed for erectile dysfunction.   TRAZODONE HCL PO Take by mouth.   [DISCONTINUED] atorvastatin (LIPITOR) 40 MG tablet TAKE 1 TABLET(40 MG) BY MOUTH DAILY   [DISCONTINUED] losartan (COZAAR) 50 MG tablet TAKE 1 TABLET(50 MG) BY MOUTH DAILY   No facility-administered medications prior to visit.    Review of Systems  All other systems reviewed and are negative.         Objective:     BP 128/62   Pulse 69   Temp 97.9 F (36.6 C) (Oral)   Resp 14   Ht 5' 9.5" (1.765 m)   Wt 195 lb 6.4 oz (88.6 kg)   SpO2 99% Comment: room air  BMI 28.44 kg/m    Physical Exam   Alert and in no distress. Tympanic membranes and canals are normal after cerumen was removed from his ears. Pharyngeal area is normal. Neck is supple without adenopathy or thyromegaly. Cardiac exam shows a regular sinus rhythm without murmurs or gallops. Lungs are clear to auscultation.  Abdominal exam shows no masses or tenderness  with normal bowel sounds      Assessment & Plan:     [facility] and use me as a backup or for urgent and emergent problems. Immunization History  Administered Date(s) Administered   Fluad Quad(high Dose 65+) 12/26/2019, 02/01/2021   Influenza Split 01/16/2014, 12/21/2015, 02/08/2022   Influenza,inj,Quad PF,6+ Mos 03/19/2013, 04/29/2015, 03/13/2017   Influenza,inj,quad, With Preservative 06/11/2018   Influenza-Unspecified 01/16/2010, 01/17/2012, 01/27/2016, 12/18/2019   PFIZER(Purple Top)SARS-COV-2 Vaccination 05/24/2019, 06/14/2019, 02/13/2020   Pneumococcal Polysaccharide-23 05/05/2020   Tdap 03/19/2013, 11/20/2014   Zoster Recombinat (Shingrix) 06/25/2018, 08/27/2018   Zoster, Live 04/29/2015    Health Maintenance  Topic Date Due   Pneumonia Vaccine 80+ Years old (2 of 2 - PCV)  05/05/2021   COVID-19 Vaccine (4 - 2023-24 season) 12/17/2021   INFLUENZA VACCINE  11/17/2022   Medicare Annual Wellness (AWV)  08/16/2023   DTaP/Tdap/Td (3 - Td or Tdap) 11/19/2024   Colonoscopy  06/21/2029   Hepatitis C Screening  Completed   Zoster Vaccines- Shingrix  Completed   HPV VACCINES  Aged Out    Discussed health benefits of physical activity, and encouraged him to engage in regular exercise appropriate for his age and condition.  Problem List Items Addressed This Visit     Allergic rhinitis   Diverticulosis of colon without hemorrhage   Erectile dysfunction   Essential hypertension   Relevant Medications   losartan (COZAAR) 50 MG tablet   atorvastatin (LIPITOR) 40 MG tablet   Glucose intolerance   History of colonic polyps   Hyperlipidemia   Relevant Medications   losartan (COZAAR) 50 MG tablet   atorvastatin (LIPITOR) 40 MG tablet   Sleep apnea   Other Visit Diagnoses     Routine general medical examination at a health care facility    -  Primary   Screening for diabetes mellitus          No follow-ups on file.     Sharlot Gowda, MD

## 2022-12-13 ENCOUNTER — Encounter: Payer: Self-pay | Admitting: Family Medicine

## 2022-12-13 ENCOUNTER — Ambulatory Visit (INDEPENDENT_AMBULATORY_CARE_PROVIDER_SITE_OTHER): Payer: Medicare PPO | Admitting: Family Medicine

## 2022-12-13 VITALS — BP 130/72 | HR 68 | Ht 69.5 in | Wt 195.6 lb

## 2022-12-13 DIAGNOSIS — J302 Other seasonal allergic rhinitis: Secondary | ICD-10-CM

## 2022-12-13 DIAGNOSIS — G4733 Obstructive sleep apnea (adult) (pediatric): Secondary | ICD-10-CM | POA: Diagnosis not present

## 2022-12-13 NOTE — Progress Notes (Signed)
   Subjective:    Patient ID: Antonio Harvey, male    DOB: [birth year], 67 y.o.   MRN: [id number]  HPI He is here for consult concerning allergic rhinitis.  He has had allergy symptoms for as long as I have taken care of him since the early 90s.  Also he has a history of sleep apnea and on CPAP for this.   He also has a history of seeing an allergist here in town who treated him for allergic rhinitis as well as asthma.  He needs this to take to the [place] for   Review of Systems     Objective:    Physical Exam Alert and in no distress otherwise not examined       Assessment & Plan:  Seasonal allergic rhinitis, unspecified trigger  Obstructive sleep apnea syndrome

## 2022-12-15 ENCOUNTER — Ambulatory Visit (INDEPENDENT_AMBULATORY_CARE_PROVIDER_SITE_OTHER): Payer: Medicare PPO | Admitting: Medical

## 2022-12-15 VITALS — BP 110/70 | Wt 195.6 lb

## 2022-12-15 DIAGNOSIS — S90852A Superficial foreign body, left foot, initial encounter: Secondary | ICD-10-CM | POA: Diagnosis not present

## 2022-12-15 NOTE — Progress Notes (Signed)
Subjective:  Antonio Harvey is a 67 y.o. male who presents for Chief Complaint  Patient presents with   glass in foot    Glass in left foot     Here for possible foreign body.  His wife broke a glass on the floor yesterday.  He ended up stepping on a little piece of the glass and he thinks he has a small piece of it in the heel.  No other complaints    The following portions of the patient's history were reviewed and updated as appropriate: allergies, current medications, past family history, past medical history, past social history, past surgical history and problem list.  ROS Otherwise as in subjective above  Objective: BP 110/70   Wt 195 lb 9.6 oz (88.7 kg)   BMI 28.47 kg/m   General appearance: alert, no distress, well developed, well nourished Left foot heel volar surface with possible small foreign body, small opening flat, no surrounding erythema induration or fluctuance Only tender around the area of the foreign body   Assessment: Encounter Diagnosis  Name Primary?   Foreign body in left foot, initial encounter Yes     Plan: We discussed concerns and symptoms and findings.  He agreed to incision and exploration  Cleaned and prepped left foot usual sterile fashion.  Used 1% lidocaine with epi to locally anesthetize the heel surface.  Used a scalpel and forceps to successfully remove a small 2 mm x 3 mm triangular piece of glass out of the heel of the left foot..  Covered with sterile bandage.  Discussed wound care   Patient Instructions  I removed a small triangular shard of glass from your foot.  If possible go home and soak your foot in some soapy hot water bath or Epsom salts water for 20 to 30 minutes  Keep the foot clean with soap and water the next few days.  You may put a little bit of Neosporin or triple antibiotic and a bandage over the foot for a few days.  If any signs of redness or worse pain or infection, then get reevaluated  Otherwise this  should heal up just fine on its own    Antonio Harvey was seen today for glass in foot.  Diagnoses and all orders for this visit:  Foreign body in left foot, initial encounter    Follow up: prn

## 2022-12-15 NOTE — Patient Instructions (Signed)
I removed a small triangular shard of glass from your foot.  If possible go home and soak your foot in some soapy hot water bath or Epsom salts water for 20 to 30 minutes  Keep the foot clean with soap and water the next few days.  You may put a little bit of Neosporin or triple antibiotic and a bandage over the foot for a few days.  If any signs of redness or worse pain or infection, then get reevaluated  Otherwise this should heal up just fine on its own

## 2023-03-14 ENCOUNTER — Ambulatory Visit (INDEPENDENT_AMBULATORY_CARE_PROVIDER_SITE_OTHER): Payer: Medicare PPO | Admitting: Family Medicine

## 2023-03-14 ENCOUNTER — Encounter: Payer: Self-pay | Admitting: Family Medicine

## 2023-03-14 VITALS — BP 138/74 | HR 74 | Ht 69.0 in | Wt 198.0 lb

## 2023-03-14 DIAGNOSIS — H8110 Benign paroxysmal vertigo, unspecified ear: Secondary | ICD-10-CM | POA: Diagnosis not present

## 2023-03-14 NOTE — Progress Notes (Signed)
   Subjective:    Patient ID: Antonio Harvey, male    DOB: Dec 17, 1955, 67 y.o.   MRN: [id number]  HPI He states that on Saturday he woke up and noted that the room was spinning.  He laid back down and then slowly moved his head from side-to-side and the spinning went away.  He had another smaller episode of this this morning.  No associated blurred or double vision, nausea, vomiting, weakness numbness or tingling.   Review of Systems     Objective:    Physical Exam Alert and in no distress.  EOMI.  Other cranial nerves grossly intact.       Assessment & Plan:  Benign paroxysmal positional vertigo, unspecified laterality I explained what the problem was and actually gave him information concerning the Epley maneuvers.  He was comfortable with that.

## 2023-03-14 NOTE — Patient Instructions (Signed)
How to Perform the Epley Maneuver The Epley maneuver is an exercise that relieves symptoms of vertigo. Vertigo is the feeling that you or your surroundings are moving when they are not. When you feel vertigo, you may feel like the room is spinning and may have trouble walking. The Epley maneuver is used for a type of vertigo caused by a calcium deposit in a part of the inner ear. The maneuver involves changing head positions to help the deposit move out of the area. You can do this maneuver at home whenever you have symptoms of vertigo. You can repeat it in 24 hours if your vertigo has not gone away. Even though the Epley maneuver may relieve your vertigo for a few weeks, it is possible that your symptoms will return. This maneuver relieves vertigo, but it does not relieve dizziness. What are the risks? If it is done correctly, the Epley maneuver is considered safe. Sometimes it can lead to dizziness or nausea that goes away after a short time. If you develop other symptoms--such as changes in vision, weakness, or numbness--stop doing the maneuver and call your health care provider. Supplies needed: A bed or table. A pillow. How to do the Epley maneuver     Sit on the edge of a bed or table with your back straight and your legs extended or hanging over the edge of the bed or table. Turn your head halfway toward the affected ear or side as told by your health care provider. Lie backward quickly with your head turned until you are lying flat on your back. Your head should dangle (head-hanging position). You may want to position a pillow under your shoulders. Hold this position for at least 30 seconds. If you feel dizzy or have symptoms of vertigo, continue to hold the position until the symptoms stop. Turn your head to the opposite direction until your unaffected ear is facing down. Your head should continue to dangle. Hold this position for at least 30 seconds. If you feel dizzy or have symptoms of  vertigo, continue to hold the position until the symptoms stop. Turn your whole body to the same side as your head so that you are positioned on your side. Your head will now be nearly facedown and no longer needs to dangle. Hold for at least 30 seconds. If you feel dizzy or have symptoms of vertigo, continue to hold the position until the symptoms stop. Sit back up. You can repeat the maneuver in 24 hours if your vertigo does not go away. Follow these instructions at home: For 24 hours after doing the Epley maneuver: Keep your head in an upright position. When lying down to sleep or rest, keep your head raised (elevated) with two or more pillows. Avoid excessive neck movements. Activity Do not drive or use machinery if you feel dizzy. After doing the Epley maneuver, return to your normal activities as told by your health care provider. Ask your health care provider what activities are safe for you. General instructions Drink enough fluid to keep your urine pale yellow. Do not drink alcohol. Take over-the-counter and prescription medicines only as told by your health care provider. Keep all follow-up visits. This is important. Preventing vertigo symptoms Ask your health care provider if there is anything you should do at home to prevent vertigo. He or she may recommend that you: Keep your head elevated with two or more pillows while you sleep. Do not sleep on the side of your affected ear. Get  up slowly from bed. Avoid sudden movements during the day. Avoid extreme head positions or movement, such as looking up or bending over. Contact a health care provider if: Your vertigo gets worse. You have other symptoms, including: Nausea. Vomiting. Headache. Get help right away if you: Have vision changes. Have a headache or neck pain that is severe or getting worse. Cannot stop vomiting. Have new numbness or weakness in any part of your body. These symptoms may represent a serious problem  that is an emergency. Do not wait to see if the symptoms will go away. Get medical help right away. Call your local emergency services (911 in the [place] Do not drive yourself to the hospital. Summary Vertigo is the feeling that you or your surroundings are moving when they are not. The Epley maneuver is an exercise that relieves symptoms of vertigo. If the Epley maneuver is done correctly, it is considered safe. This information is not intended to replace advice given to you by your health care provider. Make sure you discuss any questions you have with your health care provider. Document Revised: 03/04/2020 Document Reviewed: 03/04/2020 Elsevier Patient Education  2024 [employer].

## 2023-06-13 ENCOUNTER — Encounter: Payer: Self-pay | Admitting: Internal Medicine

## 2023-08-22 ENCOUNTER — Ambulatory Visit: Payer: Medicare PPO

## 2023-08-22 VITALS — BP 124/62 | HR 60 | Temp 97.7°F | Ht 69.0 in | Wt 197.4 lb

## 2023-08-22 DIAGNOSIS — Z0000 Encounter for general adult medical examination without abnormal findings: Secondary | ICD-10-CM

## 2023-08-22 NOTE — Progress Notes (Signed)
 Subjective:   Antonio Harvey is a 68 y.o. who presents for a Medicare Wellness preventive visit.  Visit Complete: In person    Persons Participating in Visit: Patient.  AWV Questionnaire: No: Patient Medicare AWV questionnaire was not completed prior to this visit.  Cardiac Risk Factors include: advanced age (>87men, >74 women);dyslipidemia;hypertension;male gender     Objective:    Today's Vitals   08/22/23 0811  BP: 124/62  Pulse: 60  Temp: 97.7 F (36.5 C)  TempSrc: Oral  SpO2: 98%  Weight: 197 lb 6.4 oz (89.5 kg)  Height: 5\' 9"  (1.753 m)   Body mass index is 29.15 kg/m.     08/22/2023    8:20 AM 08/16/2022    8:40 AM 08/13/2021    8:52 AM 07/17/2020    1:45 PM 09/13/2019    9:20 PM 05/27/2015   10:07 AM 05/21/2015    1:44 PM  Advanced Directives  Does Patient Have a Medical Advance Directive? No No No Yes No No No  Type of Advance Directive    Living will     Does patient want to make changes to medical advance directive?    Yes (Inpatient - patient defers changing a medical advance directive and declines information at this time)     Would patient like information on creating a medical advance directive? No - Patient declined No - Patient declined No - Patient declined   Yes - Educational materials given     Current Medications (verified) Outpatient Encounter Medications as of 08/22/2023  Medication Sig   atorvastatin  (LIPITOR) 40 MG tablet TAKE 1 TABLET(40 MG) BY MOUTH DAILY   diclofenac Sodium (VOLTAREN) 1 % GEL    fluticasone (FLONASE) 50 MCG/ACT nasal spray    lidocaine  (LIDODERM ) 5 % Place 1 patch onto the skin daily.   losartan  (COZAAR ) 50 MG tablet TAKE 1 TABLET(50 MG) BY MOUTH DAILY   tadalafil  (CIALIS ) 20 MG tablet Take 1 tablet (20 mg total) by mouth daily as needed for erectile dysfunction.   TRAZODONE HCL PO Take by mouth.   acetaminophen  (TYLENOL ) 500 MG tablet Take 500 mg by mouth every 6 (six) hours as needed. (Patient not taking: Reported on  08/22/2023)   No facility-administered encounter medications on file as of 08/22/2023.    Allergies (verified) Sildenafil    History: Past Medical History:  Diagnosis Date   Allergy    Chronic LBP    Diverticulosis    History of colonic polyps    Hyperlipidemia    Hypertension    Sleep apnea    Past Surgical History:  Procedure Laterality Date   CYST REMOVAL HAND Right 04/30/2021   GANGLION CYST EXCISION Left 05/27/2015   Procedure: REMOVAL GANGLION CYST OF LEFT 3RD FINGER;  Surgeon: Wes Hamman, MD;  Location: [facility];  Service: Orthopedics;  Laterality: Left;   INCISION AND DRAINAGE PERIRECTAL ABSCESS N/A 06/28/2013   Procedure: IRRIGATION AND DEBRIDEMENT PERIRECTAL ABSCESS;  Surgeon: Rogena Class, MD;  Location: [facility];  Service: General;  Laterality: N/A;   KNEE SURGERY Right    08/2022   thumb surgery Right 05/24/2022   TONSILLECTOMY AND ADENOIDECTOMY  1966   Family History  Problem Relation Age of Onset   Colon polyps Mother    Asthma Mother    Hypertension Mother    Stroke Father        died of CVA   Kidney disease Father        dialysis  Diabetes Father    Arthritis Sister    Colon cancer Neg Hx    Esophageal cancer Neg Hx    Stomach cancer Neg Hx    Rectal cancer Neg Hx    Social History   Socioeconomic History   Marital status: Married    Spouse name: Not on file   Number of children: Not on file   Years of education: Not on file   Highest education level: Not on file  Occupational History   Not on file  Tobacco Use   Smoking status: Never   Smokeless tobacco: Never  Vaping Use   Vaping status: Never Used  Substance and Sexual Activity   Alcohol use: Yes    Alcohol/week: 1.0 standard drink of alcohol    Types: 1 Cans of beer per week   Drug use: No   Sexual activity: Yes  Other Topics Concern   Not on file  Social History Narrative   Not on file   Social Drivers of Health   Financial Resource Strain: Low Risk   (08/22/2023)   Overall Financial Resource Strain (CARDIA)    Difficulty of Paying Living Expenses: Not hard at all  Food Insecurity: No Food Insecurity (08/22/2023)   Hunger Vital Sign    Worried About Running Out of Food in the Last Year: Never true    Ran Out of Food in the Last Year: Never true  Transportation Needs: No Transportation Needs (08/22/2023)   PRAPARE - Administrator, Civil Service (Medical): No    Lack of Transportation (Non-Medical): No  Physical Activity: Inactive (08/22/2023)   Exercise Vital Sign    Days of Exercise per Week: 0 days    Minutes of Exercise per Session: 0 min  Stress: No Stress Concern Present (08/22/2023)   [employer] of Occupational Health - Occupational Stress Questionnaire    Feeling of Stress : Not at all  Social Connections: Moderately Integrated (08/22/2023)   Social Connection and Isolation Panel [NHANES]    Frequency of Communication with Friends and Family: Once a week    Frequency of Social Gatherings with Friends and Family: Once a week    Attends Religious Services: More than 4 times per year    Active Member of [employer] or Organizations: Yes    Attends Engineer, structural: More than 4 times per year    Marital Status: Married    Tobacco Counseling Counseling given: Not Answered    Clinical Intake:  Pre-visit preparation completed: Yes  Pain : No/denies pain     Nutritional Status: BMI 25 -29 Overweight Nutritional Risks: None Diabetes: No  Lab Results  Component Value Date   HGBA1C 6.0 (A) 09/14/2021   HGBA1C 5.6 07/17/2020   HGBA1C 6.1 (A) 12/26/2019     How often do you need to have someone help you when you read instructions, pamphlets, or other written materials from your doctor or pharmacy?: 1 - Never  Interpreter Needed?: No  Information entered by :: NAllen LPN   Activities of Daily Living     08/22/2023    8:12 AM  In your present state of health, do you have any difficulty performing  the following activities:  Hearing? 0  Vision? 0  Difficulty concentrating or making decisions? 0  Walking or climbing stairs? 1  Comment bad knee  Dressing or bathing? 0  Doing errands, shopping? 0  Preparing Food and eating ? N  Using the Toilet? N  In the past six  months, have you accidently leaked urine? N  Do you have problems with loss of bowel control? N  Managing your Medications? N  Managing your Finances? N  Housekeeping or managing your Housekeeping? N    Patient Care Team: Watson Hacking, MD as PCP - General (Family Medicine)  Indicate any recent Medical Services you may have received from other than [facility] providers in the past year (date may be approximate).     Assessment:   This is a routine wellness examination for Antonio Harvey.  Hearing/Vision screen Hearing Screening - Comments:: Denies hearing issues Vision Screening - Comments:: [facility]   Goals Addressed             This Visit's Progress    Patient Stated       08/22/2023, [facility] and [facility]       Depression Screen     08/22/2023    8:21 AM 08/16/2022    8:44 AM 08/13/2021    8:54 AM 07/17/2020    1:47 PM 12/26/2019   11:11 AM  PHQ 2/9 Scores  PHQ - 2 Score 0 0 0 0 1  PHQ- 9 Score 3  3      Fall Risk     08/22/2023    8:21 AM 08/16/2022    8:41 AM 03/08/2022    8:20 AM 08/13/2021    8:54 AM 07/17/2020    1:46 PM  Fall Risk   Falls in the past year? 0 1 0 1 0  Comment  [place]  tripped   Number falls in past yr: 0 0 0 1 0  Injury with Fall? 0 0 0 0 0  Comment  cut leg     Risk for fall due to : Medication side effect Medication side effect No Fall Risks Medication side effect No Fall Risks  Follow up Falls prevention discussed;Falls evaluation completed Falls prevention discussed;Education provided;Falls evaluation completed Falls evaluation completed Falls evaluation completed;Education provided;Falls prevention discussed Falls  evaluation completed    MEDICARE RISK AT HOME:  Medicare Risk at Home Any stairs in or around the home?: Yes If so, are there any without handrails?: No Home free of loose throw rugs in walkways, pet beds, electrical cords, etc?: Yes Adequate lighting in your home to reduce risk of falls?: Yes Life alert?: No Use of a cane, walker or w/c?: No Grab bars in the bathroom?: Yes Shower chair or bench in shower?: Yes Elevated toilet seat or a handicapped toilet?: Yes  TIMED UP AND GO:  Was the test performed?  Yes  Length of time to ambulate 10 feet: 5 sec Gait steady and fast without use of assistive device  Cognitive Function: 6CIT completed        08/22/2023    8:23 AM 08/16/2022    8:46 AM 08/13/2021    9:00 AM  6CIT Screen  What Year? 0 points 0 points 0 points  What month? 0 points 0 points 0 points  What time? 0 points 0 points 0 points  Count back from 20 0 points 0 points 0 points  Months in reverse 0 points 0 points 0 points  Repeat phrase 0 points 0 points 0 points  Total Score 0 points 0 points 0 points    Immunizations Immunization History  Administered Date(s) Administered   Fluad Quad(high Dose 65+) 12/26/2019, 02/01/2021   Influenza Split 01/16/2014, 12/21/2015, 02/08/2022   Influenza,  High Dose Seasonal PF 01/05/2023   Influenza,inj,Quad PF,6+ Mos 03/19/2013, 04/29/2015, 03/13/2017   Influenza,inj,quad, With Preservative 06/11/2018   Influenza-Unspecified 01/16/2010, 01/17/2012, 01/27/2016, 12/18/2019   PFIZER(Purple Top)SARS-COV-2 Vaccination 05/24/2019, 06/14/2019, 02/13/2020   Pfizer Covid-19 Vaccine Bivalent Booster 53yrs & up 03/10/2021   Pfizer(Comirnaty)Fall Seasonal Vaccine 12 years and older 02/08/2022   Pneumococcal Polysaccharide-23 05/05/2020   Tdap 03/19/2013, 11/20/2014, 03/13/2022   Zoster Recombinant(Shingrix ) 06/25/2018, 08/27/2018   Zoster, Live 04/29/2015    Screening Tests Health Maintenance  Topic Date Due   Pneumonia Vaccine 18+  Years old (2 of 2 - PCV) 05/05/2021   COVID-19 Vaccine (6 - 2024-25 season) 12/18/2022   INFLUENZA VACCINE  11/17/2023   Medicare Annual Wellness (AWV)  08/21/2024   Colonoscopy  06/21/2029   DTaP/Tdap/Td (4 - Td or Tdap) 03/13/2032   Hepatitis C Screening  Completed   Zoster Vaccines- Shingrix   Completed   HPV VACCINES  Aged Out   Meningococcal B Vaccine  Aged Out    Health Maintenance  Health Maintenance Due  Topic Date Due   Pneumonia Vaccine 78+ Years old (2 of 2 - PCV) 05/05/2021   COVID-19 Vaccine (6 - 2024-25 season) 12/18/2022   Health Maintenance Items Addressed: States had pneumonia at the [place]. Covid will get [facility]  Additional Screening:  Vision Screening: Recommended annual ophthalmology exams for early detection of glaucoma and other disorders of the eye.  Dental Screening: Recommended annual dental exams for proper oral hygiene  Community Resource Referral / Chronic Care Management: CRR required this visit?  No   CCM required this visit?  No     Plan:     I have personally reviewed and noted the following in the patient's chart:   Medical and social history Use of alcohol, tobacco or illicit drugs  Current medications and supplements including opioid prescriptions. Patient is not currently taking opioid prescriptions. Functional ability and status Nutritional status Physical activity Advanced directives List of other physicians Hospitalizations, surgeries, and ER visits in previous 12 months Vitals Screenings to include cognitive, depression, and falls Referrals and appointments  In addition, I have reviewed and discussed with patient certain preventive protocols, quality metrics, and best practice recommendations. A written personalized care plan for preventive services as well as general preventive health recommendations were provided to patient.     Areatha Beecham, LPN   04/23/1094   After Visit Summary: (In Person-Printed) AVS printed and  given to the patient  Notes: Nothing significant to report at this time.

## 2023-08-22 NOTE — Patient Instructions (Signed)
 Mr. Bress , Thank you for taking time to come for your Medicare Wellness Visit. I appreciate your ongoing commitment to your health goals. Please review the following plan we discussed and let me know if I can assist you in the future.   Referrals/Orders/Follow-Ups/Clinician Recommendations: none  This is a list of the screening recommended for you and due dates:  Health Maintenance  Topic Date Due   Pneumonia Vaccine (2 of 2 - PCV) 05/05/2021   COVID-19 Vaccine (6 - 2024-25 season) 12/18/2022   Flu Shot  11/17/2023   Medicare Annual Wellness Visit  08/21/2024   Colon Cancer Screening  06/21/2029   DTaP/Tdap/Td vaccine (4 - Td or Tdap) 03/13/2032   Hepatitis C Screening  Completed   Zoster (Shingles) Vaccine  Completed   HPV Vaccine  Aged Out   Meningitis B Vaccine  Aged Out    Advanced directives: (Declined) Advance directive discussed with you today. Even though you declined this today, please call our office should you change your mind, and we can give you the proper paperwork for you to fill out.  Next Medicare Annual Wellness Visit scheduled for next year: No, wants to do in August. Schedule not open yet  Have you seen your provider in the last 6 months (3 months if uncontrolled diabetes)? Yes, seen in November  insert Preventive Care attachment Insert FALL PREVENTION attachment if needed

## 2023-09-28 ENCOUNTER — Ambulatory Visit: Payer: Medicare PPO | Admitting: Family Medicine

## 2023-10-14 ENCOUNTER — Other Ambulatory Visit: Payer: Self-pay | Admitting: Family Medicine

## 2023-10-14 DIAGNOSIS — E785 Hyperlipidemia, unspecified: Secondary | ICD-10-CM

## 2023-11-14 ENCOUNTER — Other Ambulatory Visit: Payer: Self-pay | Admitting: Family Medicine

## 2023-11-14 DIAGNOSIS — I10 Essential (primary) hypertension: Secondary | ICD-10-CM

## 2023-12-05 ENCOUNTER — Ambulatory Visit (INDEPENDENT_AMBULATORY_CARE_PROVIDER_SITE_OTHER): Admitting: Family Medicine

## 2023-12-05 ENCOUNTER — Encounter: Payer: Self-pay | Admitting: Family Medicine

## 2023-12-05 VITALS — BP 118/72 | HR 55 | Ht 69.0 in | Wt 196.2 lb

## 2023-12-05 DIAGNOSIS — E785 Hyperlipidemia, unspecified: Secondary | ICD-10-CM | POA: Diagnosis not present

## 2023-12-05 DIAGNOSIS — I10 Essential (primary) hypertension: Secondary | ICD-10-CM

## 2023-12-05 DIAGNOSIS — Z131 Encounter for screening for diabetes mellitus: Secondary | ICD-10-CM | POA: Diagnosis not present

## 2023-12-05 DIAGNOSIS — N529 Male erectile dysfunction, unspecified: Secondary | ICD-10-CM | POA: Diagnosis not present

## 2023-12-05 DIAGNOSIS — R0789 Other chest pain: Secondary | ICD-10-CM | POA: Diagnosis not present

## 2023-12-05 LAB — POCT GLYCOSYLATED HEMOGLOBIN (HGB A1C): Hemoglobin A1C: 5.7 % — AB (ref 4.0–5.6)

## 2023-12-05 NOTE — Progress Notes (Signed)
 Name: Antonio Harvey   Date of Visit: 12/05/23   Date of last visit with me: Visit date not found   CHIEF COMPLAINT:  Chief Complaint  Patient presents with   Annual Exam    Cpe only. Having pain down left arm, going into chest.        HPI:  Discussed the use of AI scribe software for clinical note transcription with the patient, who gave verbal consent to proceed.  History of Present Illness   Antonio Harvey is a 68 year old male who presents with arm pain and tingling.  He has been experiencing pain and tingling in his arm for the past couple of weeks. The pain radiates down his arm and across the shoulder area, accompanied by a tingling sensation. The symptoms are intermittent, often occurring late at night when lying on his shoulder, prompting him to change positions. The pain does not extend into his hands or neck, and he does not experience these symptoms during physical activities such as yard work.  No associated symptoms such as shortness of breath, dizziness, or urinary symptoms. He reports that his blood pressure is usually where it is supposed to be, but occasionally becomes borderline, particularly in stressful situations like traffic.  He has a history of prediabetes and uses a sleep apnea machine at night. Family history includes a couple of uncles with heart attacks, but no significant history of heart attacks in his immediate family. His mother lived to 55 years and his father, a World War II veteran, lived to 43 years. He receives his flu vaccine annually through the [facility] and believes he has received the pneumococcal vaccine there as well.         OBJECTIVE:       12/05/2023    8:10 AM  Depression screen PHQ 2/9  Decreased Interest 0  Down, Depressed, Hopeless 0  PHQ - 2 Score 0     BP Readings from Last 3 Encounters:  12/05/23 118/72  08/22/23 124/62  03/14/23 138/74    BP 118/72   Pulse (!) 55   Ht 5' 9 (1.753 m)   Wt 196 lb 3.2 oz (89 kg)   SpO2  97%   BMI 28.97 kg/m    Physical Exam          Physical Exam Constitutional:      Appearance: Normal appearance.  Cardiovascular:     Rate and Rhythm: Normal rate.  Neurological:     General: No focal deficit present.     Mental Status: He is alert and oriented to person, place, and time. Mental status is at baseline.     ASSESSMENT/PLAN:   Assessment & Plan Hyperlipidemia, unspecified hyperlipidemia type  Essential hypertension  Screening for diabetes mellitus  Chest discomfort  Erectile dysfunction, unspecified erectile dysfunction type    Assessment and Plan    Chest and left arm pain under evaluation for cardiac etiology Intermittent pain, low suspicion for cardiac cause, but cardiac evaluation necessary. - Perform EKG in office. EKG shows no acute signs of cocner.  - Order coronary artery calcium  score with self-pay option at $99. - Consider echocardiogram if coronary artery calcium  score is not feasible.  Hypertension Blood pressure generally well-controlled, occasionally borderline. - Monitor blood pressure regularly. - Well controlled, continue Cozaar  50mg  for now.   Prediabetes Previously diagnosed, reassessment needed. - Order basic blood work including cmp, cbc, lipid panel   Obstructive sleep apnea Uses CPAP, no new concerns.  General Health Maintenance Up to date on most vaccinations and screenings, annual flu vaccine through [facility]. - [facility] - Schedule next Cologuard screening for next year.         Darilyn Storbeck A. Vita MD [facility]

## 2023-12-06 ENCOUNTER — Ambulatory Visit: Payer: Self-pay | Admitting: Family Medicine

## 2023-12-06 DIAGNOSIS — R718 Other abnormality of red blood cells: Secondary | ICD-10-CM

## 2023-12-06 LAB — COMPREHENSIVE METABOLIC PANEL WITH GFR
ALT: 16 IU/L (ref 0–44)
AST: 15 IU/L (ref 0–40)
Albumin: 4.9 g/dL (ref 3.9–4.9)
Alkaline Phosphatase: 115 IU/L (ref 44–121)
BUN/Creatinine Ratio: 13 (ref 10–24)
BUN: 18 mg/dL (ref 8–27)
Bilirubin Total: 0.5 mg/dL (ref 0.0–1.2)
CO2: 21 mmol/L (ref 20–29)
Calcium: 9.5 mg/dL (ref 8.6–10.2)
Chloride: 105 mmol/L (ref 96–106)
Creatinine, Ser: 1.35 mg/dL — ABNORMAL HIGH (ref 0.76–1.27)
Globulin, Total: 2.5 g/dL (ref 1.5–4.5)
Glucose: 94 mg/dL (ref 70–99)
Potassium: 4.2 mmol/L (ref 3.5–5.2)
Sodium: 141 mmol/L (ref 134–144)
Total Protein: 7.4 g/dL (ref 6.0–8.5)
eGFR: 57 mL/min/1.73 — ABNORMAL LOW (ref >59)

## 2023-12-06 LAB — CBC WITH DIFFERENTIAL/PLATELET
Basophils Absolute: 0 x10E3/uL (ref 0.0–0.2)
Basos: 0 %
EOS (ABSOLUTE): 0 x10E3/uL (ref 0.0–0.4)
Eos: 1 %
Hematocrit: 46.7 % (ref 37.5–51.0)
Hemoglobin: 14 g/dL (ref 13.0–17.7)
Immature Grans (Abs): 0 x10E3/uL (ref 0.0–0.1)
Immature Granulocytes: 0 %
Lymphocytes Absolute: 1.5 x10E3/uL (ref 0.7–3.1)
Lymphs: 28 %
MCH: 23 pg — ABNORMAL LOW (ref 26.6–33.0)
MCHC: 30 g/dL — ABNORMAL LOW (ref 31.5–35.7)
MCV: 77 fL — ABNORMAL LOW (ref 79–97)
Monocytes Absolute: 0.4 x10E3/uL (ref 0.1–0.9)
Monocytes: 8 %
Neutrophils Absolute: 3.3 x10E3/uL (ref 1.4–7.0)
Neutrophils: 63 %
Platelets: 162 x10E3/uL (ref 150–450)
RBC: 6.1 x10E6/uL — ABNORMAL HIGH (ref 4.14–5.80)
RDW: 17.8 % — ABNORMAL HIGH (ref 11.6–15.4)
WBC: 5.3 x10E3/uL (ref 3.4–10.8)

## 2023-12-06 LAB — LIPID PANEL
Chol/HDL Ratio: 3.2 ratio (ref 0.0–5.0)
Cholesterol, Total: 152 mg/dL (ref 100–199)
HDL: 47 mg/dL (ref >39)
LDL Chol Calc (NIH): 90 mg/dL (ref 0–99)
Triglycerides: 77 mg/dL (ref 0–149)
VLDL Cholesterol Cal: 15 mg/dL (ref 5–40)

## 2023-12-06 LAB — PSA: Prostate Specific Ag, Serum: 1.5 ng/mL (ref 0.0–4.0)

## 2023-12-07 ENCOUNTER — Telehealth: Payer: Self-pay

## 2023-12-07 NOTE — Telephone Encounter (Signed)
 Copied from CRM [id number]. Topic: Clinical - Lab/Test Results >> Dec 07, 2023 10:38 AM Graeme ORN wrote: Reason for CRM: Patient returned missed call for labs. Patient has already received message yesterday and has lab appt set for Tuesday. No Further questions at this time. Thank You.

## 2023-12-12 ENCOUNTER — Other Ambulatory Visit

## 2023-12-12 DIAGNOSIS — R718 Other abnormality of red blood cells: Secondary | ICD-10-CM | POA: Diagnosis not present

## 2023-12-13 ENCOUNTER — Ambulatory Visit: Payer: Self-pay | Admitting: Family Medicine

## 2023-12-13 LAB — IRON,TIBC AND FERRITIN PANEL
Ferritin: 337 ng/mL (ref 30–400)
Iron Saturation: 40 % (ref 15–55)
Iron: 105 ug/dL (ref 38–169)
Total Iron Binding Capacity: 264 ug/dL (ref 250–450)
UIBC: 159 ug/dL (ref 111–343)

## 2023-12-15 ENCOUNTER — Telehealth: Payer: Self-pay

## 2023-12-15 NOTE — Telephone Encounter (Signed)
 Copied from CRM #[id number]. Topic: Clinical - Lab/Test Results >> Dec 14, 2023 10:54 AM Charlet HERO wrote: Reason for CRM: Patient is returning call informed Iron looks good, lets follow up in 4 months to repeat his blood work and see if it changes  Patient is informed of lab results.

## 2023-12-28 ENCOUNTER — Ambulatory Visit (HOSPITAL_BASED_OUTPATIENT_CLINIC_OR_DEPARTMENT_OTHER)
Admission: RE | Admit: 2023-12-28 | Discharge: 2023-12-28 | Disposition: A | Discharge status: Home / Self Care | Payer: Self-pay | Source: Ambulatory Visit | Attending: Family Medicine | Admitting: Family Medicine

## 2023-12-28 DIAGNOSIS — R0789 Other chest pain: Secondary | ICD-10-CM | POA: Insufficient documentation

## 2024-02-03 DIAGNOSIS — E785 Hyperlipidemia, unspecified: Secondary | ICD-10-CM | POA: Diagnosis not present

## 2024-02-03 DIAGNOSIS — N529 Male erectile dysfunction, unspecified: Secondary | ICD-10-CM | POA: Diagnosis not present

## 2024-02-03 DIAGNOSIS — Z833 Family history of diabetes mellitus: Secondary | ICD-10-CM | POA: Diagnosis not present

## 2024-02-03 DIAGNOSIS — G4733 Obstructive sleep apnea (adult) (pediatric): Secondary | ICD-10-CM | POA: Diagnosis not present

## 2024-02-03 DIAGNOSIS — Z809 Family history of malignant neoplasm, unspecified: Secondary | ICD-10-CM | POA: Diagnosis not present

## 2024-02-03 DIAGNOSIS — Z8249 Family history of ischemic heart disease and other diseases of the circulatory system: Secondary | ICD-10-CM | POA: Diagnosis not present

## 2024-02-03 DIAGNOSIS — J302 Other seasonal allergic rhinitis: Secondary | ICD-10-CM | POA: Diagnosis not present

## 2024-02-03 DIAGNOSIS — N1831 Chronic kidney disease, stage 3a: Secondary | ICD-10-CM | POA: Diagnosis not present

## 2024-02-03 DIAGNOSIS — I129 Hypertensive chronic kidney disease with stage 1 through stage 4 chronic kidney disease, or unspecified chronic kidney disease: Secondary | ICD-10-CM | POA: Diagnosis not present

## 2024-03-08 NOTE — Progress Notes (Signed)
   03/08/2024  Patient ID: Antonio Harvey, male   DOB: 1955-06-14, 68 y.o.   MRN: [id number]  Pharmacy Quality Measure Review  This patient is appearing on a report for being at risk of failing the adherence measure for cholesterol (statin) medications this calendar year.   Medication: Atorvastatin  Last fill date: 02/28/24 for 90 day supply  Insurance report was not up to date. No action needed at this time.   Antonio Harvey, PharmD Clinical Pharmacist [phone]

## 2024-04-05 ENCOUNTER — Encounter: Payer: Self-pay | Admitting: Family Medicine

## 2024-04-05 ENCOUNTER — Other Ambulatory Visit (INDEPENDENT_AMBULATORY_CARE_PROVIDER_SITE_OTHER): Payer: Self-pay | Admitting: Family Medicine

## 2024-04-05 VITALS — BP 128/82 | HR 67 | Wt 202.8 lb

## 2024-04-05 DIAGNOSIS — R718 Other abnormality of red blood cells: Secondary | ICD-10-CM

## 2024-04-05 DIAGNOSIS — Z23 Encounter for immunization: Secondary | ICD-10-CM | POA: Diagnosis not present

## 2024-04-05 LAB — COMPREHENSIVE METABOLIC PANEL WITH GFR
ALT: 23 IU/L (ref 0–44)
AST: 20 IU/L (ref 0–40)
Albumin: 4.5 g/dL (ref 3.9–4.9)
Alkaline Phosphatase: 111 IU/L (ref 47–123)
BUN/Creatinine Ratio: 13 (ref 10–24)
BUN: 19 mg/dL (ref 8–27)
Bilirubin Total: 0.4 mg/dL (ref 0.0–1.2)
CO2: 22 mmol/L (ref 20–29)
Calcium: 9.3 mg/dL (ref 8.6–10.2)
Chloride: 105 mmol/L (ref 96–106)
Creatinine, Ser: 1.43 mg/dL — ABNORMAL HIGH (ref 0.76–1.27)
Globulin, Total: 2.5 g/dL (ref 1.5–4.5)
Glucose: 90 mg/dL (ref 70–99)
Potassium: 4.2 mmol/L (ref 3.5–5.2)
Sodium: 141 mmol/L (ref 134–144)
Total Protein: 7 g/dL (ref 6.0–8.5)
eGFR: 53 mL/min/1.73 — ABNORMAL LOW

## 2024-04-05 LAB — IRON,TIBC AND FERRITIN PANEL
Ferritin: 377 ng/mL (ref 30–400)
Iron Saturation: 39 % (ref 15–55)
Iron: 104 ug/dL (ref 38–169)
Total Iron Binding Capacity: 265 ug/dL (ref 250–450)
UIBC: 161 ug/dL (ref 111–343)

## 2024-04-05 NOTE — Progress Notes (Signed)
" ° °  Name: Antonio Harvey   Date of Visit: 04/05/2024   Date of last visit with me: 12/05/2023   CHIEF COMPLAINT:  Chief Complaint  Patient presents with   Follow-up    4 month follow       HPI:  Discussed the use of AI scribe software for clinical note transcription with the patient, who gave verbal consent to proceed.  History of Present Illness   Antonio Harvey is a 68 year old male who presents for follow-up regarding small red blood cell size.  He has had microcytosis for at least six years, with previous evaluations showing normal iron levels.  He is not currently taking any supplements or medications regularly. He has noticed changes in his eating habits, often skipping breakfast or having a light meal, which may contribute to nutritional deficiencies. He acknowledges that he might not be consuming enough nutrients at his age.  He has been using Flonase for a long time, which was recently recalled. He uses it in the morning due to dryness caused by a machine he uses.         OBJECTIVE:       12/05/2023    8:10 AM  Depression screen PHQ 2/9  Decreased Interest 0  Down, Depressed, Hopeless 0  PHQ - 2 Score 0     BP Readings from Last 3 Encounters:  04/05/24 128/82  12/05/23 118/72  08/22/23 124/62    BP 128/82   Pulse 67   Wt 202 lb 12.8 oz (92 kg)   BMI 29.95 kg/m    Physical Exam          Physical Exam Constitutional:      Appearance: Normal appearance.  Neurological:     General: No focal deficit present.     Mental Status: He is alert and oriented to person, place, and time. Mental status is at baseline.     ASSESSMENT/PLAN:   Assessment & Plan Low mean corpuscular volume  Need for influenza vaccination    Assessment and Plan    Microcytosis, possible iron deficiency anemia Microcytosis likely due to iron deficiency despite normal iron levels. Long-standing condition possibly influenced by diet. - Recommended daily multivitamin with at  least 75 mg iron. - Repeat blood tests in six months to assess blood cell size. - Follow-up in six months to review results.  General Health Maintenance Due for flu vaccination. - Administered flu shot.         Vaneta Hammontree A. Vita MD [facility] "

## 2024-04-05 NOTE — Addendum Note (Signed)
 Addended by: VICCI HUSBAND A on: 04/05/2024 11:34 AM   Modules accepted: Orders

## 2024-04-08 ENCOUNTER — Ambulatory Visit: Payer: Self-pay | Admitting: Family Medicine

## 2024-04-09 ENCOUNTER — Telehealth: Payer: Self-pay

## 2024-04-09 NOTE — Telephone Encounter (Signed)
 Patient advised of results.

## 2024-04-09 NOTE — Telephone Encounter (Signed)
 Labs told to pt.   Copied from CRM [id number]. Topic: Clinical - Lab/Test Results >> Apr 09, 2024 11:05 AM Amy B wrote: Reason for CRM: Patient call back for lab results.  Relayed results to patient.

## 2024-05-12 ENCOUNTER — Other Ambulatory Visit: Payer: Self-pay | Admitting: Family Medicine

## 2024-05-12 DIAGNOSIS — I10 Essential (primary) hypertension: Secondary | ICD-10-CM

## 2024-05-12 NOTE — Telephone Encounter (Signed)
 Pt scheduled later this year

## 2024-10-04 ENCOUNTER — Other Ambulatory Visit

## 2024-12-04 ENCOUNTER — Encounter: Payer: Self-pay | Admitting: Family Medicine
